# Patient Record
Sex: Female | Born: 1950 | Race: White | Hispanic: No | Marital: Married | Smoking: Never smoker
Health system: Southern US, Community
[De-identification: ages and names within clinical notes are randomized; demographics above are authoritative.]

## PROBLEM LIST (undated history)

## (undated) DIAGNOSIS — I10 Essential (primary) hypertension: Secondary | ICD-10-CM

## (undated) DIAGNOSIS — G8929 Other chronic pain: Secondary | ICD-10-CM

## (undated) DIAGNOSIS — R87629 Unspecified abnormal cytological findings in specimens from vagina: Secondary | ICD-10-CM

## (undated) DIAGNOSIS — R1310 Dysphagia, unspecified: Secondary | ICD-10-CM

## (undated) DIAGNOSIS — T7840XA Allergy, unspecified, initial encounter: Secondary | ICD-10-CM

## (undated) DIAGNOSIS — M25559 Pain in unspecified hip: Secondary | ICD-10-CM

## (undated) DIAGNOSIS — I776 Arteritis, unspecified: Secondary | ICD-10-CM

## (undated) HISTORY — DX: Unspecified abnormal cytological findings in specimens from vagina: R87.629

## (undated) HISTORY — DX: Essential (primary) hypertension: I10

## (undated) HISTORY — DX: Dysphagia, unspecified: R13.10

## (undated) HISTORY — DX: Other chronic pain: G89.29

## (undated) HISTORY — DX: Allergy, unspecified, initial encounter: T78.40XA

## (undated) HISTORY — PX: COLONOSCOPY: SHX174

## (undated) HISTORY — PX: TUBAL LIGATION: SHX77

## (undated) HISTORY — PX: OTHER SURGICAL HISTORY: SHX169

## (undated) HISTORY — DX: Pain in unspecified hip: M25.559

---

## 2009-11-01 ENCOUNTER — Other Ambulatory Visit: Admission: RE | Admit: 2009-11-01 | Discharge: 2009-11-01 | Payer: Self-pay | Admitting: Obstetrics & Gynecology

## 2009-11-28 ENCOUNTER — Ambulatory Visit (HOSPITAL_COMMUNITY): Admission: RE | Admit: 2009-11-28 | Discharge: 2009-11-28 | Payer: Self-pay | Admitting: Obstetrics & Gynecology

## 2010-12-26 ENCOUNTER — Other Ambulatory Visit: Payer: Self-pay | Admitting: Obstetrics & Gynecology

## 2010-12-26 DIAGNOSIS — Z139 Encounter for screening, unspecified: Secondary | ICD-10-CM

## 2010-12-29 ENCOUNTER — Ambulatory Visit (HOSPITAL_COMMUNITY)
Admission: RE | Admit: 2010-12-29 | Discharge: 2010-12-29 | Disposition: A | Discharge status: Home / Self Care | Payer: 59 | Source: Ambulatory Visit | Attending: Obstetrics & Gynecology | Admitting: Obstetrics & Gynecology

## 2010-12-29 DIAGNOSIS — Z1231 Encounter for screening mammogram for malignant neoplasm of breast: Secondary | ICD-10-CM | POA: Insufficient documentation

## 2010-12-29 DIAGNOSIS — Z139 Encounter for screening, unspecified: Secondary | ICD-10-CM

## 2011-01-23 ENCOUNTER — Other Ambulatory Visit: Payer: Self-pay | Admitting: Obstetrics & Gynecology

## 2011-01-23 ENCOUNTER — Other Ambulatory Visit (HOSPITAL_COMMUNITY)
Admission: RE | Admit: 2011-01-23 | Discharge: 2011-01-23 | Disposition: A | Discharge status: Home / Self Care | Payer: 59 | Source: Ambulatory Visit | Attending: Obstetrics & Gynecology | Admitting: Obstetrics & Gynecology

## 2011-01-23 DIAGNOSIS — Z01419 Encounter for gynecological examination (general) (routine) without abnormal findings: Secondary | ICD-10-CM | POA: Insufficient documentation

## 2011-12-31 ENCOUNTER — Other Ambulatory Visit: Payer: Self-pay | Admitting: Obstetrics & Gynecology

## 2011-12-31 DIAGNOSIS — Z139 Encounter for screening, unspecified: Secondary | ICD-10-CM

## 2012-01-01 ENCOUNTER — Ambulatory Visit (HOSPITAL_COMMUNITY)
Admission: RE | Admit: 2012-01-01 | Discharge: 2012-01-01 | Disposition: A | Discharge status: Home / Self Care | Payer: 59 | Source: Ambulatory Visit | Attending: Obstetrics & Gynecology | Admitting: Obstetrics & Gynecology

## 2012-01-01 DIAGNOSIS — Z1231 Encounter for screening mammogram for malignant neoplasm of breast: Secondary | ICD-10-CM | POA: Insufficient documentation

## 2012-01-01 DIAGNOSIS — Z139 Encounter for screening, unspecified: Secondary | ICD-10-CM

## 2012-03-10 ENCOUNTER — Other Ambulatory Visit: Payer: Self-pay | Admitting: Obstetrics & Gynecology

## 2012-03-10 ENCOUNTER — Other Ambulatory Visit (HOSPITAL_COMMUNITY)
Admission: RE | Admit: 2012-03-10 | Discharge: 2012-03-10 | Disposition: A | Discharge status: Home / Self Care | Payer: 59 | Source: Ambulatory Visit | Attending: Obstetrics & Gynecology | Admitting: Obstetrics & Gynecology

## 2012-03-10 DIAGNOSIS — Z01419 Encounter for gynecological examination (general) (routine) without abnormal findings: Secondary | ICD-10-CM | POA: Insufficient documentation

## 2012-11-05 ENCOUNTER — Other Ambulatory Visit (HOSPITAL_COMMUNITY): Payer: Self-pay | Admitting: *Deleted

## 2012-11-05 ENCOUNTER — Ambulatory Visit (HOSPITAL_COMMUNITY)
Admission: RE | Admit: 2012-11-05 | Discharge: 2012-11-05 | Disposition: A | Discharge status: Home / Self Care | Payer: 59 | Source: Ambulatory Visit | Attending: *Deleted | Admitting: *Deleted

## 2012-11-05 DIAGNOSIS — R05 Cough: Secondary | ICD-10-CM

## 2012-11-05 DIAGNOSIS — R059 Cough, unspecified: Secondary | ICD-10-CM | POA: Insufficient documentation

## 2012-11-05 DIAGNOSIS — R0602 Shortness of breath: Secondary | ICD-10-CM | POA: Insufficient documentation

## 2012-11-05 DIAGNOSIS — R062 Wheezing: Secondary | ICD-10-CM | POA: Insufficient documentation

## 2012-12-01 ENCOUNTER — Other Ambulatory Visit (HOSPITAL_COMMUNITY): Payer: Self-pay | Admitting: Internal Medicine

## 2012-12-01 DIAGNOSIS — R05 Cough: Secondary | ICD-10-CM

## 2012-12-12 ENCOUNTER — Ambulatory Visit (HOSPITAL_COMMUNITY)
Admission: RE | Admit: 2012-12-12 | Discharge: 2012-12-12 | Disposition: A | Discharge status: Home / Self Care | Payer: 59 | Source: Ambulatory Visit | Attending: Internal Medicine | Admitting: Internal Medicine

## 2012-12-12 DIAGNOSIS — R059 Cough, unspecified: Secondary | ICD-10-CM | POA: Insufficient documentation

## 2012-12-12 DIAGNOSIS — R05 Cough: Secondary | ICD-10-CM | POA: Insufficient documentation

## 2012-12-23 NOTE — Procedures (Signed)
NAMEJETTIE, MANNOR           ACCOUNT NO.:  [id number]  MEDICAL RECORD NO.:  [id number]  LOCATION:                                 FACILITY:  PHYSICIAN:  Jaceion Aday L. Juanetta Gosling, M.D.DATE OF BIRTH:  1950/10/03  DATE OF PROCEDURE:  12/22/2012 DATE OF DISCHARGE:                           PULMONARY FUNCTION TEST   Reason for pulmonary function testing is cough. 1. Spirometry is normal. 2. Lung volumes are normal. 3. DLCO is minimally reduced, but corrects when volume is taken into     account. 4. Airway resistance is normal. 5. No cause of cough is seen on pulmonary function testing.     Lucindia Lemley L. Juanetta Gosling, M.D.     ELH/MEDQ  D:  12/22/2012  T:  12/23/2012  Job:  [id number]  cc:   Elita Quick, MD

## 2012-12-24 LAB — PULMONARY FUNCTION TEST

## 2013-01-19 ENCOUNTER — Other Ambulatory Visit: Payer: Self-pay | Admitting: Obstetrics & Gynecology

## 2013-01-19 DIAGNOSIS — Z139 Encounter for screening, unspecified: Secondary | ICD-10-CM

## 2013-01-29 ENCOUNTER — Ambulatory Visit (HOSPITAL_COMMUNITY)
Admission: RE | Admit: 2013-01-29 | Discharge: 2013-01-29 | Disposition: A | Discharge status: Home / Self Care | Payer: 59 | Source: Ambulatory Visit | Attending: Obstetrics & Gynecology | Admitting: Obstetrics & Gynecology

## 2013-01-29 DIAGNOSIS — Z1231 Encounter for screening mammogram for malignant neoplasm of breast: Secondary | ICD-10-CM | POA: Insufficient documentation

## 2013-01-29 DIAGNOSIS — Z139 Encounter for screening, unspecified: Secondary | ICD-10-CM

## 2013-03-18 ENCOUNTER — Encounter: Payer: Self-pay | Admitting: Obstetrics & Gynecology

## 2013-03-18 ENCOUNTER — Ambulatory Visit (INDEPENDENT_AMBULATORY_CARE_PROVIDER_SITE_OTHER): Payer: 59 | Admitting: Obstetrics & Gynecology

## 2013-03-18 ENCOUNTER — Other Ambulatory Visit (HOSPITAL_COMMUNITY)
Admission: RE | Admit: 2013-03-18 | Discharge: 2013-03-18 | Disposition: A | Discharge status: Home / Self Care | Payer: 59 | Source: Ambulatory Visit | Attending: Obstetrics & Gynecology | Admitting: Obstetrics & Gynecology

## 2013-03-18 VITALS — BP 120/80 | Ht 65.0 in | Wt 196.0 lb

## 2013-03-18 DIAGNOSIS — I10 Essential (primary) hypertension: Secondary | ICD-10-CM

## 2013-03-18 DIAGNOSIS — Z889 Allergy status to unspecified drugs, medicaments and biological substances status: Secondary | ICD-10-CM

## 2013-03-18 DIAGNOSIS — Z1151 Encounter for screening for human papillomavirus (HPV): Secondary | ICD-10-CM | POA: Insufficient documentation

## 2013-03-18 DIAGNOSIS — Z1212 Encounter for screening for malignant neoplasm of rectum: Secondary | ICD-10-CM

## 2013-03-18 DIAGNOSIS — Z01419 Encounter for gynecological examination (general) (routine) without abnormal findings: Secondary | ICD-10-CM

## 2013-03-18 NOTE — Progress Notes (Signed)
Patient ID: Elizabeth Roberson, female   DOB: Dec 11, 1950, 62 y.o.   MRN: [id number] Subjective:     Elizabeth Roberson is a 62 y.o. female here for a routine exam.  No LMP recorded. Patient is postmenopausal. No obstetric history on file. Current complaints: none.  Personal health questionnaire reviewed: no.   Gynecologic History No LMP recorded. Patient is postmenopausal. Contraception: post menopausal status Last Pap: 2013. Results were: normal Last mammogram: 2014. Results were: normal  Obstetric History OB History  No data available     The following portions of the patient's history were reviewed and updated as appropriate: allergies, current medications, past family history, past medical history, past social history, past surgical history and problem list.  Review of Systems  Review of Systems  Constitutional: Negative for fever, chills, weight loss, malaise/fatigue and diaphoresis.  HENT: Negative for hearing loss, ear pain, nosebleeds, congestion, sore throat, neck pain, tinnitus and ear discharge.   Eyes: Negative for blurred vision, double vision, photophobia, pain, discharge and redness.  Respiratory: Negative for cough, hemoptysis, sputum production, shortness of breath, wheezing and stridor.   Cardiovascular: Negative for chest pain, palpitations, orthopnea, claudication, leg swelling and PND.  Gastrointestinal: negative for abdominal pain. Negative for heartburn, nausea, vomiting, diarrhea, constipation, blood in stool and melena.  Genitourinary: Negative for dysuria, urgency, frequency, hematuria and flank pain.  Musculoskeletal: Negative for myalgias, back pain, joint pain and falls.  Skin: Negative for itching and rash.  Neurological: Negative for dizziness, tingling, tremors, sensory change, speech change, focal weakness, seizures, loss of consciousness, weakness and headaches.  Endo/Heme/Allergies: Negative for environmental allergies and polydipsia. Does not  bruise/bleed easily.  Psychiatric/Behavioral: Negative for depression, suicidal ideas, hallucinations, memory loss and substance abuse. The patient is not nervous/anxious and does not have insomnia.        Objective:    Physical Exam  Vitals reviewed. Constitutional: She is oriented to person, place, and time. She appears well-developed and well-nourished.  HENT:  Head: Normocephalic and atraumatic.        Right Ear: External ear normal.  Left Ear: External ear normal.  Nose: Nose normal.  Mouth/Throat: Oropharynx is clear and moist.  Eyes: Conjunctivae and EOM are normal. Pupils are equal, round, and reactive to light. Right eye exhibits no discharge. Left eye exhibits no discharge. No scleral icterus.  Neck: Normal range of motion. Neck supple. No tracheal deviation present. No thyromegaly present.  Cardiovascular: Normal rate, regular rhythm, normal heart sounds and intact distal pulses.  Exam reveals no gallop and no friction rub.   No murmur heard. Respiratory: Effort normal and breath sounds normal. No respiratory distress. She has no wheezes. She has no rales. She exhibits no tenderness.  GI: Soft. Bowel sounds are normal. She exhibits no distension and no mass. There is no tenderness. There is no rebound and no guarding.  Genitourinary:       Vulva is normal without lesions Vagina is pink moist without discharge Cervix normal in appearance and pap is done Uterus is normal size shape and contour Adnexa is negative with normal sized ovaries   Musculoskeletal: Normal range of motion. She exhibits no edema and no tenderness.  Neurological: She is alert and oriented to person, place, and time. She has normal reflexes. She displays normal reflexes. No cranial nerve deficit. She exhibits normal muscle tone. Coordination normal.  Skin: Skin is warm and dry. No rash noted. No erythema. No pallor.  Psychiatric: She has a normal mood and affect. Her  behavior is normal. Judgment and  thought content normal.       Assessment:    Healthy female exam.    Plan:    Mammogram ordered. Follow up in: 1 year.

## 2014-02-25 ENCOUNTER — Other Ambulatory Visit: Payer: Self-pay | Admitting: Obstetrics & Gynecology

## 2014-02-25 DIAGNOSIS — Z1231 Encounter for screening mammogram for malignant neoplasm of breast: Secondary | ICD-10-CM

## 2014-03-01 ENCOUNTER — Ambulatory Visit (HOSPITAL_COMMUNITY)
Admission: RE | Admit: 2014-03-01 | Discharge: 2014-03-01 | Disposition: A | Discharge status: Home / Self Care | Payer: 59 | Source: Ambulatory Visit | Attending: Obstetrics & Gynecology | Admitting: Obstetrics & Gynecology

## 2014-03-01 DIAGNOSIS — Z1231 Encounter for screening mammogram for malignant neoplasm of breast: Secondary | ICD-10-CM

## 2014-03-01 DIAGNOSIS — R928 Other abnormal and inconclusive findings on diagnostic imaging of breast: Secondary | ICD-10-CM | POA: Insufficient documentation

## 2014-03-03 ENCOUNTER — Other Ambulatory Visit: Payer: Self-pay | Admitting: Obstetrics & Gynecology

## 2014-03-03 DIAGNOSIS — R928 Other abnormal and inconclusive findings on diagnostic imaging of breast: Secondary | ICD-10-CM

## 2014-03-16 ENCOUNTER — Ambulatory Visit (HOSPITAL_COMMUNITY)
Admission: RE | Admit: 2014-03-16 | Discharge: 2014-03-16 | Disposition: A | Discharge status: Home / Self Care | Payer: 59 | Source: Ambulatory Visit | Attending: Obstetrics & Gynecology | Admitting: Obstetrics & Gynecology

## 2014-03-16 ENCOUNTER — Other Ambulatory Visit: Payer: Self-pay | Admitting: Obstetrics & Gynecology

## 2014-03-16 DIAGNOSIS — N6489 Other specified disorders of breast: Secondary | ICD-10-CM | POA: Diagnosis present

## 2014-03-16 DIAGNOSIS — R928 Other abnormal and inconclusive findings on diagnostic imaging of breast: Secondary | ICD-10-CM

## 2014-04-09 ENCOUNTER — Ambulatory Visit (INDEPENDENT_AMBULATORY_CARE_PROVIDER_SITE_OTHER): Payer: 59 | Admitting: Obstetrics & Gynecology

## 2014-04-09 ENCOUNTER — Encounter: Payer: Self-pay | Admitting: Obstetrics & Gynecology

## 2014-04-09 VITALS — BP 122/80 | Ht 65.0 in | Wt 196.0 lb

## 2014-04-09 DIAGNOSIS — Z1212 Encounter for screening for malignant neoplasm of rectum: Secondary | ICD-10-CM

## 2014-04-09 DIAGNOSIS — Z01419 Encounter for gynecological examination (general) (routine) without abnormal findings: Secondary | ICD-10-CM

## 2014-04-09 NOTE — Progress Notes (Signed)
Patient ID: Elizabeth Roberson, female   DOB: Feb 07, 1951, 63 y.o.   MRN: [id number] Subjective:     Elizabeth Roberson is a 63 y.o. female here for a routine exam.  No LMP recorded. Patient is postmenopausal. No obstetric history on file. Birth Control Method:  Negative  Menstrual Calendar(currently): amenorrheic  Current complaints: has had episodic infrequent LLQ pain sharp lasts about 1 year.   Current acute medical issues:  none   Recent Gynecologic History No LMP recorded. Patient is postmenopausal. Last Pap: 2014,  normal Last mammogram: 02/2014,  normal  Past Medical History  Diagnosis Date  . Hypertension   . Allergy     History reviewed. No pertinent past surgical history.  OB History   Grav Para Term Preterm Abortions TAB SAB Ect Mult Living                  History   Social History  . Marital Status: Married    Spouse Name: N/A    Number of Children: N/A  . Years of Education: N/A   Social History Main Topics  . Smoking status: Never Smoker   . Smokeless tobacco: Never Used  . Alcohol Use: No  . Drug Use: No  . Sexual Activity: Yes    Birth Control/ Protection: Post-menopausal   Other Topics Concern  . None   Social History Narrative  . None    Family History  Problem Relation Age of Onset  . COPD Mother   . Alzheimer's disease Mother   . Hypertension Son      Review of Systems  Review of Systems  Constitutional: Negative for fever, chills, weight loss, malaise/fatigue and diaphoresis.  HENT: Negative for hearing loss, ear pain, nosebleeds, congestion, sore throat, neck pain, tinnitus and ear discharge.   Eyes: Negative for blurred vision, double vision, photophobia, pain, discharge and redness.  Respiratory: Negative for cough, hemoptysis, sputum production, shortness of breath, wheezing and stridor.   Cardiovascular: Negative for chest pain, palpitations, orthopnea, claudication, leg swelling and PND.  Gastrointestinal: negative for  abdominal pain. Negative for heartburn, nausea, vomiting, diarrhea, constipation, blood in stool and melena.  Genitourinary: Negative for dysuria, urgency, frequency, hematuria and flank pain.  Musculoskeletal: Negative for myalgias, back pain, joint pain and falls.  Skin: Negative for itching and rash.  Neurological: Negative for dizziness, tingling, tremors, sensory change, speech change, focal weakness, seizures, loss of consciousness, weakness and headaches.  Endo/Heme/Allergies: Negative for environmental allergies and polydipsia. Does not bruise/bleed easily.  Psychiatric/Behavioral: Negative for depression, suicidal ideas, hallucinations, memory loss and substance abuse. The patient is not nervous/anxious and does not have insomnia.        Objective:    Physical Exam  Vitals reviewed. Constitutional: She is oriented to person, place, and time. She appears well-developed and well-nourished.  HENT:  Head: Normocephalic and atraumatic.        Right Ear: External ear normal.  Left Ear: External ear normal.  Nose: Nose normal.  Mouth/Throat: Oropharynx is clear and moist.  Eyes: Conjunctivae and EOM are normal. Pupils are equal, round, and reactive to light. Right eye exhibits no discharge. Left eye exhibits no discharge. No scleral icterus.  Neck: Normal range of motion. Neck supple. No tracheal deviation present. No thyromegaly present.  Cardiovascular: Normal rate, regular rhythm, normal heart sounds and intact distal pulses.  Exam reveals no gallop and no friction rub.   No murmur heard. Respiratory: Effort normal and breath sounds normal. No respiratory distress. She  has no wheezes. She has no rales. She exhibits no tenderness.  GI: Soft. Bowel sounds are normal. She exhibits no distension and no mass. There is no tenderness. There is no rebound and no guarding.  Genitourinary:  Breasts no masses skin changes or nipple changes bilaterally      Vulva is normal without  lesions Vagina is pink moist without discharge Cervix normal in appearance and pap is done Uterus is normal size shape and contour Adnexa is negative with normal sized ovaries  Rectal    hemoccult negative, normal tone, no masses  Musculoskeletal: Normal range of motion. She exhibits no edema and no tenderness.  Neurological: She is alert and oriented to person, place, and time. She has normal reflexes. She displays normal reflexes. No cranial nerve deficit. She exhibits normal muscle tone. Coordination normal.  Skin: Skin is warm and dry. No rash noted. No erythema. No pallor.  Psychiatric: She has a normal mood and affect. Her behavior is normal. Judgment and thought content normal.       Assessment:    Healthy female exam.    Plan:    Follow up in: 1 year.

## 2014-06-22 ENCOUNTER — Encounter (INDEPENDENT_AMBULATORY_CARE_PROVIDER_SITE_OTHER): Payer: Self-pay | Admitting: *Deleted

## 2014-06-29 ENCOUNTER — Telehealth (INDEPENDENT_AMBULATORY_CARE_PROVIDER_SITE_OTHER): Payer: Self-pay | Admitting: *Deleted

## 2014-06-29 ENCOUNTER — Other Ambulatory Visit (INDEPENDENT_AMBULATORY_CARE_PROVIDER_SITE_OTHER): Payer: Self-pay | Admitting: *Deleted

## 2014-06-29 ENCOUNTER — Encounter (INDEPENDENT_AMBULATORY_CARE_PROVIDER_SITE_OTHER): Payer: Self-pay | Admitting: Internal Medicine

## 2014-06-29 ENCOUNTER — Ambulatory Visit (INDEPENDENT_AMBULATORY_CARE_PROVIDER_SITE_OTHER): Payer: 59 | Admitting: Internal Medicine

## 2014-06-29 VITALS — BP 92/70 | HR 68 | Temp 97.4°F | Ht 65.0 in | Wt 197.8 lb

## 2014-06-29 DIAGNOSIS — R1310 Dysphagia, unspecified: Secondary | ICD-10-CM

## 2014-06-29 DIAGNOSIS — Z1211 Encounter for screening for malignant neoplasm of colon: Secondary | ICD-10-CM

## 2014-06-29 DIAGNOSIS — R1314 Dysphagia, pharyngoesophageal phase: Secondary | ICD-10-CM

## 2014-06-29 MED ORDER — PEG-KCL-NACL-NASULF-NA ASC-C 100 G PO SOLR: 1.0000 | Freq: Once | ORAL | Status: DC

## 2014-06-29 NOTE — Telephone Encounter (Signed)
Patient needs movi prep 

## 2014-06-29 NOTE — Progress Notes (Addendum)
   Subjective:    Patient ID: Elizabeth Roberson, female    DOB: [birth year], 63 y.o.   MRN: [id number]  HPI Referred to our office by Dr.Junell Koberlain for dysphagia.She tells me she has had problems with dysphagia. Foods are lodging. She feel like she is going to choke. She says when the food lodges, she cannot cough it up. Foods have been lodging for 3-6 months. Not everyday, but occasionally. No specific food. Chicken tenders and rice give her problems.  Started Omeprazole and Pepcid Sunday.  She has cramps in her upper back after standing to do dishes. No acid reflux since starting the PPI.  Burping and gas are better also since starting. Appetite is good. No weight loss.  No abdominal pain. She usually has a BM daily. No melena or BRRB.   Patient also states her last colonoscopy was in [place] 10 yrs ago and was advised to have a repeat in 5 yrs. 05/25/2004 Colonoscopy Dr. Irena Cords: Guaiac positive stool Erythema at hepatic flexure. Otherwise normal colon Biopsy:Coloic mucosa with no pathologic abnormality.  No family hx of colon cancer  10/23/20154 H and H 13.9 AND 40.0, MCV 85.8, platelet ct 340 ALP 96, AST 12, ALT 14, TOTAL Bili 0.4 TSH 2.015 ESR 6 ANA negative    Review of Systems  Married, three children in good health. Works for [employer] in [place].   Past Medical History  Diagnosis Date  . Hypertension   . Allergy     Past Surgical History  Procedure Laterality Date  . Vasculitis      No Known Allergies  Current Outpatient Prescriptions on File Prior to Visit  Medication Sig Dispense Refill  . levocetirizine (XYZAL) 5 MG tablet Take 10 mg by mouth every evening.    Marland Kitchen losartan (COZAAR) 50 MG tablet Take 50 mg by mouth daily.    . MELOXICAM PO Take 1 tablet by mouth as needed.    . montelukast (SINGULAIR) 10 MG tablet Take 10 mg by mouth at bedtime.     No current facility-administered medications on file prior to visit.        Objective:   Physical Exam  Filed Vitals:   06/29/14 1027  Height: $Remove'5\' 5"'sUFRhFP$  (1.651 m)  Weight: 197 lb 12.8 oz (89.721 kg)   Alert and oriented. Skin warm and dry. Oral mucosa is moist.   . Sclera anicteric, conjunctivae is pink. Thyroid not enlarged. No cervical lymphadenopathy. Lungs clear. Heart regular rate and rhythm.  Abdomen is soft. Bowel sounds are positive. No hepatomegaly. No abdominal masses felt. No tenderness.  No edema to lower extremities.      Assessment & Plan:  PUD needs to be ruled out. Stricture also needs to be ruled with recent hx of dysphagia. EGD/ED, Colonoscopy Continue the Protonix and Pepcid for now

## 2014-06-29 NOTE — Patient Instructions (Addendum)
EGD/ED, Colonoscopy. -The risks and benefits such as perforation, bleeding, and infection were reviewed with the patient and is agreeable. 

## 2014-06-30 ENCOUNTER — Encounter (INDEPENDENT_AMBULATORY_CARE_PROVIDER_SITE_OTHER): Payer: Self-pay

## 2014-07-09 ENCOUNTER — Encounter (HOSPITAL_COMMUNITY)
Admission: RE | Disposition: A | Discharge status: Home / Self Care | Payer: Self-pay | Source: Ambulatory Visit | Attending: Internal Medicine

## 2014-07-09 ENCOUNTER — Ambulatory Visit (HOSPITAL_COMMUNITY)
Admission: RE | Admit: 2014-07-09 | Discharge: 2014-07-09 | Disposition: A | Discharge status: Home / Self Care | Payer: 59 | Source: Ambulatory Visit | Attending: Internal Medicine | Admitting: Internal Medicine

## 2014-07-09 ENCOUNTER — Other Ambulatory Visit (INDEPENDENT_AMBULATORY_CARE_PROVIDER_SITE_OTHER): Payer: Self-pay | Admitting: Internal Medicine

## 2014-07-09 ENCOUNTER — Encounter (HOSPITAL_COMMUNITY): Payer: Self-pay | Admitting: *Deleted

## 2014-07-09 DIAGNOSIS — K208 Other esophagitis: Secondary | ICD-10-CM

## 2014-07-09 DIAGNOSIS — I10 Essential (primary) hypertension: Secondary | ICD-10-CM | POA: Insufficient documentation

## 2014-07-09 DIAGNOSIS — K2960 Other gastritis without bleeding: Secondary | ICD-10-CM | POA: Diagnosis not present

## 2014-07-09 DIAGNOSIS — Z1211 Encounter for screening for malignant neoplasm of colon: Secondary | ICD-10-CM | POA: Insufficient documentation

## 2014-07-09 DIAGNOSIS — K449 Diaphragmatic hernia without obstruction or gangrene: Secondary | ICD-10-CM | POA: Diagnosis not present

## 2014-07-09 DIAGNOSIS — R1310 Dysphagia, unspecified: Secondary | ICD-10-CM | POA: Insufficient documentation

## 2014-07-09 DIAGNOSIS — Z8249 Family history of ischemic heart disease and other diseases of the circulatory system: Secondary | ICD-10-CM | POA: Diagnosis not present

## 2014-07-09 DIAGNOSIS — D122 Benign neoplasm of ascending colon: Secondary | ICD-10-CM | POA: Diagnosis not present

## 2014-07-09 DIAGNOSIS — R109 Unspecified abdominal pain: Secondary | ICD-10-CM

## 2014-07-09 DIAGNOSIS — R1013 Epigastric pain: Secondary | ICD-10-CM

## 2014-07-09 DIAGNOSIS — K210 Gastro-esophageal reflux disease with esophagitis: Secondary | ICD-10-CM | POA: Diagnosis not present

## 2014-07-09 DIAGNOSIS — Z820 Family history of epilepsy and other diseases of the nervous system: Secondary | ICD-10-CM | POA: Diagnosis not present

## 2014-07-09 DIAGNOSIS — K644 Residual hemorrhoidal skin tags: Secondary | ICD-10-CM | POA: Insufficient documentation

## 2014-07-09 DIAGNOSIS — K6389 Other specified diseases of intestine: Secondary | ICD-10-CM

## 2014-07-09 HISTORY — DX: Arteritis, unspecified: I77.6

## 2014-07-09 HISTORY — PX: BALLOON DILATION: SHX5330

## 2014-07-09 HISTORY — PX: MALONEY DILATION: SHX5535

## 2014-07-09 HISTORY — PX: ESOPHAGOGASTRODUODENOSCOPY: SHX5428

## 2014-07-09 HISTORY — PX: COLONOSCOPY: SHX5424

## 2014-07-09 SURGERY — COLONOSCOPY
Anesthesia: Moderate Sedation

## 2014-07-09 MED ORDER — BUTAMBEN-TETRACAINE-BENZOCAINE 2-2-14 % EX AERO
INHALATION_SPRAY | CUTANEOUS | Status: DC | PRN
  Administered 2014-07-09: 2 via TOPICAL

## 2014-07-09 MED ORDER — MEPERIDINE HCL 50 MG/ML IJ SOLN
INTRAMUSCULAR | Status: AC
  Filled 2014-07-09: qty 1

## 2014-07-09 MED ORDER — MIDAZOLAM HCL 5 MG/5ML IJ SOLN
INTRAMUSCULAR | Status: DC | PRN
  Administered 2014-07-09 (×6): 2 mg via INTRAVENOUS

## 2014-07-09 MED ORDER — MIDAZOLAM HCL 5 MG/5ML IJ SOLN
INTRAMUSCULAR | Status: AC
  Filled 2014-07-09: qty 5

## 2014-07-09 MED ORDER — MIDAZOLAM HCL 5 MG/5ML IJ SOLN
INTRAMUSCULAR | Status: AC
  Filled 2014-07-09: qty 10

## 2014-07-09 MED ORDER — MEPERIDINE HCL 50 MG/ML IJ SOLN
INTRAMUSCULAR | Status: DC | PRN
  Administered 2014-07-09 (×2): 25 mg via INTRAVENOUS

## 2014-07-09 MED ORDER — SODIUM CHLORIDE 0.9 % IV SOLN
INTRAVENOUS | Status: DC
  Administered 2014-07-09: 07:00:00 via INTRAVENOUS

## 2014-07-09 MED ORDER — STERILE WATER FOR IRRIGATION IR SOLN
Status: DC | PRN
  Administered 2014-07-09: 08:00:00

## 2014-07-09 NOTE — Op Note (Signed)
EGD PROCEDURE REPORT  PATIENT:  Elizabeth Roberson  MR#:  [id number] Birthdate:  02-12-1951, 63 y.o., female Endoscopist:  Dr. Rogene Houston, MD Referred By:  Dr. Caren Macadam, MD Procedure Date: 07/09/2014  Procedure:   EGD, ED & Colonoscopy  Indications:  Patient is 63 year old Caucasian female who presents with 3-6 month history of intermittent solid food dysphagia. She does not have frequent heartburn. She was recently begun on omeprazole. She is also undergoing average risk screening colonoscopy. Last exam was 10 years ago.            Informed Consent:  The risks, benefits, alternatives & imponderables which include, but are not limited to, bleeding, infection, perforation, drug reaction and potential missed lesion have been reviewed.  The potential for biopsy, lesion removal, esophageal dilation, etc. have also been discussed.  Questions have been answered.  All parties agreeable.  Please see history & physical in medical record for more information.  Medications:  Demerol 50 mg IV Versed 12 mg IV Cetacaine spray topically for oropharyngeal anesthesia  EGD  Description of procedure:  The endoscope was introduced through the mouth and advanced to the second portion of the duodenum without difficulty or limitations. The mucosal surfaces were surveyed very carefully during advancement of the scope and upon withdrawal.  Findings:  Esophagus:  Mucosa of the proximal segment was normal. Mucosa in the distal half of the esophagus revealed coarse appearance and linear furrows but no obvious stricture noted. There was edema and erythema to mucosa at GE junction without ring or stricture formation. GEJ:  37 cm Hiatus:  39 cm Stomach:  Stomach was empty and distended very well with insufflation. Folds in the proximal stomach were normal. Examination of mucosa at gastric body was normal. Scattered antral erosions were noted. Pyloric channel was patent. Angularis fundus and cardia were  examined by retroflexion of scope and were normal. Duodenum:  Normal bulbar and post bulbar mucosa.  Therapeutic/Diagnostic Maneuvers Performed:   GE junction and distal segment of esophagus was dilated with balloon dilator. Balloon dilator was advanced through the scope. Guidewire was pushed to gastric lumen. Balloon dye was positioned across GE junction and insufflated to a diameter of 15 mm subsequent to 16.5 and 18 mm. Balloon dye was pushed distally and then deflated. No mucosal disruption was noted. Esophagus was also dilated by passing 54 [place] Maloney dilator to full insertion. Endoscope was passed again. No mucosal disruption noted to any segment of the esophagus. Antral biopsy was taken for CLOtest followed by esophageal biopsy looking for changes of eosinophilic esophagitis.  COLONOSCOPY Description of procedure:  After a digital rectal exam was performed, that colonoscope was advanced from the anus through the rectum and colon to the area of the cecum, ileocecal valve and appendiceal orifice. The cecum was deeply intubated. These structures were well-seen and photographed for the record. From the level of the cecum and ileocecal valve, the scope was slowly and cautiously withdrawn. The mucosal surfaces were carefully surveyed utilizing scope tip to flexion to facilitate fold flattening as needed. The scope was pulled down into the rectum where a thorough exam including retroflexion was performed.  Findings:   Prep satisfactory. Small polyp ablated via cold biopsy from ascending colon. Mucosa of rest of the colon and rectum was normal.  Small hemorrhoids below the dentate line along with three small anal papillae.  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 Mild changes of  reflux esophagitis limited to GE junction. Small sliding hiatal hernia. Erosive antral gastritis. GE junction was dilated with balloon dilator to 18 mm no mucosal disruption induced. Esophagus is also dilated by passing 54 [place] Maloney dilator and once again no mucosal disruption noted. Antral biopsy taken for CLOtest and esophageal biopsy taken to rule out eosinophilic esophagitis.  Colonoscopy findings; Examination performed to cecum. Small polyp ablated via cold biopsy from ascending colon. External hemorrhoids with 3 small anal papillae.   Recommendations:  Continue anti-reflux measures. Continue omeprazole 20 mg by mouth every morning. Upper abdominal ultrasound to be scheduled to further evaluate postprandial upper abdominal pain. I will be contacting patient with biopsy results.  REHMAN,NAJEEB U  07/09/2014 8:38 AM  CC: Dr. Ethlyn Gallery, Kassie Mends, MD & Dr. Rayne Du ref. provider found

## 2014-07-09 NOTE — H&P (Signed)
Elizabeth Roberson is an 63 y.o. female.   Chief Complaint: Patient is here for EGD, ED and colonoscopy. HPI: Patient is 63 year old Caucasian female who presents with 3-6 months history of intermittent solid food dysphagia. She points to suprasternal area soft bolus obstruction. Food bolus eventually passes down after 5-10 minutes. She rarely has heartburn. She denies nausea vomiting. Lately she's been experiencing midepigastric pain radiating into her back usually after evening meal. She denies melena or rectal bleeding. She's also undergoing screening colonoscopy. Last exam was in 2005. Family studies negative for CRC.  Past Medical History  Diagnosis Date  . Hypertension   . Allergy   . Vasculitis     Past Surgical History  Procedure Laterality Date  . Colonoscopy    . Tubal ligation    . Laser surgery on cervix      Family History  Problem Relation Age of Onset  . COPD Mother   . Alzheimer's disease Mother   . Hypertension Son    Social History:  reports that she has never smoked. She has never used smokeless tobacco. She reports that she does not drink alcohol or use illicit drugs.  Allergies: No Known Allergies  Medications Prior to Admission  Medication Sig Dispense Refill  . albuterol (PROVENTIL HFA;VENTOLIN HFA) 108 (90 BASE) MCG/ACT inhaler Inhale 2 puffs into the lungs every 6 (six) hours as needed for wheezing or shortness of breath.    . levocetirizine (XYZAL) 5 MG tablet Take 10 mg by mouth every evening.    Marland Kitchen losartan-hydrochlorothiazide (HYZAAR) 50-12.5 MG per tablet Take 1 tablet by mouth daily.  5  . meloxicam (MOBIC) 15 MG tablet Take 1 tablet by mouth daily as needed for pain.   5  . montelukast (SINGULAIR) 10 MG tablet Take 10 mg by mouth at bedtime.    Marland Kitchen omeprazole (PRILOSEC) 20 MG capsule Take 20 mg by mouth daily.    . peg 3350 powder (MOVIPREP) 100 G SOLR Take 1 kit (200 g total) by mouth once. 1 kit 0    No results found for this or any previous  visit (from the past 48 hour(s)). No results found.  ROS  Blood pressure 128/75, pulse 72, temperature 97.8 F (36.6 C), temperature source Oral, resp. rate 18, height _0  (1.651 m), weight 197 lb (89.359 kg), SpO2 97 %. Physical Exam  Constitutional: She appears well-developed and well-nourished.  HENT:  Mouth/Throat: Oropharynx is clear and moist.  Eyes: Conjunctivae are normal. No scleral icterus.  Neck: No thyromegaly present.  Cardiovascular: Normal rate, regular rhythm and normal heart sounds.   No murmur heard. Respiratory: Effort normal and breath sounds normal.  GI: Soft. She exhibits no distension and no mass. There is no tenderness.  Musculoskeletal: She exhibits no edema.  Lymphadenopathy:    She has no cervical adenopathy.  Neurological: She is alert.  Skin: Skin is warm and dry.     Assessment/Plan Solid food dysphagia. Postprandial upper abdominal pain. EGD with ED and average risk screening colonoscopy.  [name] U 07/09/2014, 7:37 AM

## 2014-07-09 NOTE — Discharge Instructions (Signed)
Resume usual medications and diet. No driving for 24 hours. Upper abdominal ultrasound to be scheduled.-Monday 12/21 at 7:45am. Nothing to eat or drink after midnight before your procedure. Physician will call with biopsy results.  Esophagogastroduodenoscopy Care After Refer to this sheet in the next few weeks. These instructions provide you with information on caring for yourself after your procedure. Your caregiver may also give you more specific instructions. Your treatment has been planned according to current medical practices, but problems sometimes occur. Call your caregiver if you have any problems or questions after your procedure.  HOME CARE INSTRUCTIONS  Do not eat or drink anything until the numbing medicine (local anesthetic) has worn off and your gag reflex has returned. You will know that the local anesthetic has worn off when you can swallow comfortably.  Do not drive for 12 hours after the procedure or as directed by your caregiver.  Only take medicines as directed by your caregiver. SEEK MEDICAL CARE IF:   You cannot stop coughing.  You are not urinating at all or less than usual. SEEK IMMEDIATE MEDICAL CARE IF:  You have difficulty swallowing.  You cannot eat or drink.  You have worsening throat or chest pain.  You have dizziness, lightheadedness, or you faint.  You have nausea or vomiting.  You have chills.  You have a fever.  You have severe abdominal pain.  You have black, tarry, or bloody stools. Document Released: 06/25/2012 Document Reviewed: 06/25/2012 [facility] Patient Information 2015 [place]. This information is not intended to replace advice given to you by your health care provider. Make sure you discuss any questions you have with your health care provider.   Gastroesophageal Reflux Disease, Adult Gastroesophageal reflux disease (GERD) happens when acid from your stomach goes into your food pipe (esophagus). The acid can cause a  burning feeling in your chest. Over time, the acid can make small holes (ulcers) in your food pipe.  HOME CARE  Ask your doctor for advice about:  Losing weight.  Quitting smoking.  Alcohol use.  Avoid foods and drinks that make your problems worse. You may want to avoid:  Caffeine and alcohol.  Chocolate.  Mints.  Garlic and onions.  Spicy foods.  Citrus fruits, such as oranges, lemons, or limes.  Foods that contain tomato, such as sauce, chili, salsa, and pizza.  Fried and fatty foods.  Avoid lying down for 3 hours before you go to bed or before you take a nap.  Eat small meals often, instead of large meals.  Wear loose-fitting clothing. Do not wear anything tight around your waist.  Raise (elevate) the head of your bed 6 to 8 inches with wood blocks. Using extra pillows does not help.  Only take medicines as told by your doctor.  Do not take aspirin or ibuprofen. GET HELP RIGHT AWAY IF:   You have pain in your arms, neck, jaw, teeth, or back.  Your pain gets worse or changes.  You feel sick to your stomach (nauseous), throw up (vomit), or sweat (diaphoresis).  You feel short of breath, or you pass out (faint).  Your throw up is green, yellow, black, or looks like coffee grounds or blood.  Your poop (stool) is red, bloody, or black. MAKE SURE YOU:   Understand these instructions.  Will watch your condition.  Will get help right away if you are not doing well or get worse. Document Released: 12/26/2007 Document Revised: 10/01/2011 Document Reviewed: 01/26/2011 [facility] Patient Information 2015 [place], [place].  This information is not intended to replace advice given to you by your health care provider. Make sure you discuss any questions you have with your health care provider.    Colonoscopy, Care After Refer to this sheet in the next few weeks. These instructions provide you with information on caring for yourself after your procedure. Your health  care provider may also give you more specific instructions. Your treatment has been planned according to current medical practices, but problems sometimes occur. Call your health care provider if you have any problems or questions after your procedure. WHAT TO EXPECT AFTER THE PROCEDURE  After your procedure, it is typical to have the following:  A small amount of blood in your stool.  Moderate amounts of gas and mild abdominal cramping or bloating. HOME CARE INSTRUCTIONS  Do not drive, operate machinery, or sign important documents for 24 hours.  You may shower and resume your regular physical activities, but move at a slower pace for the first 24 hours.  Take frequent rest periods for the first 24 hours.  Walk around or put a warm pack on your abdomen to help reduce abdominal cramping and bloating.  Drink enough fluids to keep your urine clear or pale yellow.  You may resume your normal diet as instructed by your health care provider. Avoid heavy or fried foods that are hard to digest.  Avoid drinking alcohol for 24 hours or as instructed by your health care provider.  Only take over-the-counter or prescription medicines as directed by your health care provider.  If a tissue sample (biopsy) was taken during your procedure:  Do not take aspirin or blood thinners for 7 days, or as instructed by your health care provider.  Do not drink alcohol for 7 days, or as instructed by your health care provider.  Eat soft foods for the first 24 hours. SEEK MEDICAL CARE IF: You have persistent spotting of blood in your stool 2-3 days after the procedure. SEEK IMMEDIATE MEDICAL CARE IF:  You have more than a small spotting of blood in your stool.  You pass large blood clots in your stool.  Your abdomen is swollen (distended).  You have nausea or vomiting.  You have a fever.  You have increasing abdominal pain that is not relieved with medicine. Document Released: 02/21/2004 Document  Revised: 04/29/2013 Document Reviewed: 03/16/2013 [facility] Patient Information 2015 [place], [place]. This information is not intended to replace advice given to you by your health care provider. Make sure you discuss any questions you have with your health care provider.  Colon Polyps Polyps are lumps of extra tissue growing inside the body. Polyps can grow in the large intestine (colon). Most colon polyps are noncancerous (benign). However, some colon polyps can become cancerous over time. Polyps that are larger than a pea may be harmful. To be safe, caregivers remove and test all polyps. CAUSES  Polyps form when mutations in the genes cause your cells to grow and divide even though no more tissue is needed. RISK FACTORS There are a number of risk factors that can increase your chances of getting colon polyps. They include:  Being older than 50 years.  Family history of colon polyps or colon cancer.  Long-term colon diseases, such as colitis or Crohn disease.  Being overweight.  Smoking.  Being inactive.  Drinking too much alcohol. SYMPTOMS  Most small polyps do not cause symptoms. If symptoms are present, they may include:  Blood in the stool. The stool may look  dark red or black.  Constipation or diarrhea that lasts longer than 1 week. DIAGNOSIS People often do not know they have polyps until their caregiver finds them during a regular checkup. Your caregiver can use 4 tests to check for polyps:  Digital rectal exam. The caregiver wears gloves and feels inside the rectum. This test would find polyps only in the rectum.  Barium enema. The caregiver puts a liquid called barium into your rectum before taking X-rays of your colon. Barium makes your colon look Hessie Varone. Polyps are dark, so they are easy to see in the X-ray pictures.  Sigmoidoscopy. A thin, flexible tube (sigmoidoscope) is placed into your rectum. The sigmoidoscope has a light and tiny camera in it. The caregiver uses  the sigmoidoscope to look at the last third of your colon.  Colonoscopy. This test is like sigmoidoscopy, but the caregiver looks at the entire colon. This is the most common method for finding and removing polyps. TREATMENT  Any polyps will be removed during a sigmoidoscopy or colonoscopy. The polyps are then tested for cancer. PREVENTION  To help lower your risk of getting more colon polyps:  Eat plenty of fruits and vegetables. Avoid eating fatty foods.  Do not smoke.  Avoid drinking alcohol.  Exercise every day.  Lose weight if recommended by your caregiver.  Eat plenty of calcium and folate. Foods that are rich in calcium include milk, cheese, and broccoli. Foods that are rich in folate include chickpeas, kidney beans, and spinach. HOME CARE INSTRUCTIONS Keep all follow-up appointments as directed by your caregiver. You may need periodic exams to check for polyps. SEEK MEDICAL CARE IF: You notice bleeding during a bowel movement. Document Released: 04/04/2004 Document Revised: 10/01/2011 Document Reviewed: 09/18/2011 [facility] Patient Information 2015 [place], [place]. This information is not intended to replace advice given to you by your health care provider. Make sure you discuss any questions you have with your health care provider.

## 2014-07-12 ENCOUNTER — Ambulatory Visit (HOSPITAL_COMMUNITY)
Admit: 2014-07-12 | Discharge: 2014-07-12 | Disposition: A | Discharge status: Home / Self Care | Payer: 59 | Source: Ambulatory Visit | Attending: Internal Medicine | Admitting: Internal Medicine

## 2014-07-12 DIAGNOSIS — K824 Cholesterolosis of gallbladder: Secondary | ICD-10-CM | POA: Insufficient documentation

## 2014-07-12 DIAGNOSIS — R1013 Epigastric pain: Secondary | ICD-10-CM | POA: Insufficient documentation

## 2014-07-12 DIAGNOSIS — R109 Unspecified abdominal pain: Secondary | ICD-10-CM

## 2014-07-13 ENCOUNTER — Encounter (HOSPITAL_COMMUNITY): Payer: Self-pay | Admitting: Internal Medicine

## 2014-08-02 ENCOUNTER — Encounter (INDEPENDENT_AMBULATORY_CARE_PROVIDER_SITE_OTHER): Payer: Self-pay | Admitting: *Deleted

## 2014-12-24 ENCOUNTER — Other Ambulatory Visit (HOSPITAL_COMMUNITY): Payer: Self-pay | Admitting: Family Medicine

## 2014-12-24 ENCOUNTER — Ambulatory Visit (HOSPITAL_COMMUNITY)
Admission: RE | Admit: 2014-12-24 | Discharge: 2014-12-24 | Disposition: A | Discharge status: Home / Self Care | Payer: 59 | Source: Ambulatory Visit | Attending: Family Medicine | Admitting: Family Medicine

## 2014-12-24 ENCOUNTER — Ambulatory Visit (HOSPITAL_COMMUNITY): Payer: 59

## 2014-12-24 DIAGNOSIS — M25552 Pain in left hip: Secondary | ICD-10-CM

## 2014-12-24 DIAGNOSIS — M25551 Pain in right hip: Secondary | ICD-10-CM

## 2015-01-27 ENCOUNTER — Encounter (INDEPENDENT_AMBULATORY_CARE_PROVIDER_SITE_OTHER): Payer: Self-pay | Admitting: *Deleted

## 2015-05-19 ENCOUNTER — Other Ambulatory Visit: Payer: Self-pay | Admitting: Obstetrics & Gynecology

## 2015-05-19 DIAGNOSIS — Z1231 Encounter for screening mammogram for malignant neoplasm of breast: Secondary | ICD-10-CM

## 2015-06-02 ENCOUNTER — Ambulatory Visit (HOSPITAL_COMMUNITY)
Admission: RE | Admit: 2015-06-02 | Discharge: 2015-06-02 | Disposition: A | Discharge status: Home / Self Care | Payer: 59 | Source: Ambulatory Visit | Attending: Obstetrics & Gynecology | Admitting: Obstetrics & Gynecology

## 2015-06-02 DIAGNOSIS — Z1231 Encounter for screening mammogram for malignant neoplasm of breast: Secondary | ICD-10-CM | POA: Insufficient documentation

## 2015-07-25 MED FILL — LOSARTAN-HCTZ 50-12.5 MG TA: 50-12.5 | 90 days supply | Qty: 90 | Fill #0

## 2015-07-26 MED FILL — MONTELUKAST SOD 10 MG TAB: 10 | 90 days supply | Qty: 90 | Fill #0

## 2015-08-09 DIAGNOSIS — Z1389 Encounter for screening for other disorder: Secondary | ICD-10-CM | POA: Diagnosis not present

## 2015-08-09 DIAGNOSIS — J209 Acute bronchitis, unspecified: Secondary | ICD-10-CM | POA: Diagnosis not present

## 2015-08-09 DIAGNOSIS — Z6831 Body mass index (BMI) 31.0-31.9, adult: Secondary | ICD-10-CM | POA: Diagnosis not present

## 2015-09-21 DIAGNOSIS — E6609 Other obesity due to excess calories: Secondary | ICD-10-CM | POA: Diagnosis not present

## 2015-09-21 DIAGNOSIS — R05 Cough: Secondary | ICD-10-CM | POA: Diagnosis not present

## 2015-09-21 DIAGNOSIS — K219 Gastro-esophageal reflux disease without esophagitis: Secondary | ICD-10-CM | POA: Diagnosis not present

## 2015-09-21 DIAGNOSIS — Z1389 Encounter for screening for other disorder: Secondary | ICD-10-CM | POA: Diagnosis not present

## 2015-09-21 DIAGNOSIS — Z6832 Body mass index (BMI) 32.0-32.9, adult: Secondary | ICD-10-CM | POA: Diagnosis not present

## 2015-09-28 DIAGNOSIS — H524 Presbyopia: Secondary | ICD-10-CM | POA: Diagnosis not present

## 2015-10-31 MED FILL — LOSARTAN-HCTZ 50-12.5 MG TA: 50-12.5 | 90 days supply | Qty: 90 | Fill #1

## 2015-10-31 MED FILL — MONTELUKAST SOD 10 MG TAB: 10 | 90 days supply | Qty: 90 | Fill #1

## 2015-10-31 MED FILL — LEVOCETIRIZINE 5 MG TABLET: 5 | 90 days supply | Qty: 90 | Fill #1

## 2015-12-12 ENCOUNTER — Other Ambulatory Visit (HOSPITAL_COMMUNITY)
Admission: RE | Admit: 2015-12-12 | Discharge: 2015-12-12 | Disposition: A | Discharge status: Home / Self Care | Payer: 59 | Source: Ambulatory Visit | Attending: Adult Health | Admitting: Adult Health

## 2015-12-12 ENCOUNTER — Encounter: Payer: Self-pay | Admitting: Adult Health

## 2015-12-12 ENCOUNTER — Ambulatory Visit (INDEPENDENT_AMBULATORY_CARE_PROVIDER_SITE_OTHER): Payer: 59 | Admitting: Adult Health

## 2015-12-12 VITALS — BP 134/82 | HR 70 | Ht 64.75 in | Wt 200.0 lb

## 2015-12-12 DIAGNOSIS — G8929 Other chronic pain: Secondary | ICD-10-CM | POA: Insufficient documentation

## 2015-12-12 DIAGNOSIS — Z01419 Encounter for gynecological examination (general) (routine) without abnormal findings: Secondary | ICD-10-CM | POA: Insufficient documentation

## 2015-12-12 DIAGNOSIS — Z01411 Encounter for gynecological examination (general) (routine) with abnormal findings: Secondary | ICD-10-CM

## 2015-12-12 DIAGNOSIS — Z1151 Encounter for screening for human papillomavirus (HPV): Secondary | ICD-10-CM | POA: Diagnosis not present

## 2015-12-12 DIAGNOSIS — Z1211 Encounter for screening for malignant neoplasm of colon: Secondary | ICD-10-CM

## 2015-12-12 DIAGNOSIS — M25559 Pain in unspecified hip: Secondary | ICD-10-CM | POA: Diagnosis not present

## 2015-12-12 HISTORY — DX: Other chronic pain: G89.29

## 2015-12-12 LAB — HEMOCCULT GUIAC POC 1CARD (OFFICE): FECAL OCCULT BLD: NEGATIVE

## 2015-12-12 NOTE — Patient Instructions (Signed)
Physical in 1 year Pap in 3 if normal Mammogram yearly Colonoscopy per Dr Laural Golden

## 2015-12-12 NOTE — Progress Notes (Signed)
Patient ID: Elizabeth Roberson, female   DOB: 11/12/1950, 65 y.o.   MRN: [id number] History of Present Illness: Ashrita is a 65 year old white female, married in for a well woman gyn exam and pap.She complains of bilateral pain for past 20 years when walking for any time, had negative x-ays but has not had orthopedic consult, had DEXA years ago.She can bend and stretch with no problems, but if walks continuously for period of time hips hurt, but if stops and rests can continue then will hurt again. PCP is [facility].   Current Medications, Allergies, Past Medical History, Past Surgical History, Family History and Social History were reviewed in [employer] record.     Review of Systems: Patient denies any headaches, hearing loss, fatigue, blurred vision, shortness of breath, chest pain, abdominal pain, problems with bowel movements, urination, or intercourse(not often). No mood swings.+bilateral chronic hip pain    Physical Exam:BP 134/82 mmHg  Pulse 70  Ht 5' 4.75" (1.645 m)  Wt 200 lb (90.719 kg)  BMI 33.52 kg/m2 General:  Well developed, well nourished, no acute distress Skin:  Warm and dry,no rashes Neck:  Midline trachea, normal thyroid, good ROM, no lymphadenopathy,no carotid bruits heard Lungs; Clear to auscultation bilaterally Breast:  No dominant palpable mass, retraction, or nipple discharge Cardiovascular: Regular rate and rhythm Abdomen:  Soft, non tender, no hepatosplenomegaly Pelvic:  External genitalia is normal in appearance, no lesions.  The vagina has decreased color and rugae. The cervix is bulbous.  Uterus is felt to be normal size, shape, and contour.  No adnexal masses or tenderness noted.Bladder is non tender, no masses felt.urthrea has no masses or lesions Pap with HPV performed. Rectal: Good sphincter tone, no polyps, or hemorrhoids felt.  Hemoccult negative. Extremities/musculoskeletal:  No swelling or varicosities noted, no clubbing  or cyanosis Psych:  No mood changes, alert and cooperative,seems happy Encouraged to see orthopedic and get F/U DEXA and check vitamin D when has labs in June.Discussed water therapy might be good choice, discuss with PCP  Impression: Well woman gyn exam and pap Chronic bilateral hip pain    Plan: Physical in 1 year, pap in 3 if normal Mammogram yearly Colonoscopy 2025 Labs with PCP

## 2015-12-14 LAB — CYTOLOGY - PAP

## 2016-01-05 DIAGNOSIS — I776 Arteritis, unspecified: Secondary | ICD-10-CM | POA: Diagnosis not present

## 2016-01-05 DIAGNOSIS — M2550 Pain in unspecified joint: Secondary | ICD-10-CM | POA: Diagnosis not present

## 2016-01-05 DIAGNOSIS — K219 Gastro-esophageal reflux disease without esophagitis: Secondary | ICD-10-CM | POA: Diagnosis not present

## 2016-01-05 DIAGNOSIS — I10 Essential (primary) hypertension: Secondary | ICD-10-CM | POA: Diagnosis not present

## 2016-01-05 DIAGNOSIS — Z6833 Body mass index (BMI) 33.0-33.9, adult: Secondary | ICD-10-CM | POA: Diagnosis not present

## 2016-01-05 DIAGNOSIS — M549 Dorsalgia, unspecified: Secondary | ICD-10-CM | POA: Diagnosis not present

## 2016-01-05 DIAGNOSIS — Z1389 Encounter for screening for other disorder: Secondary | ICD-10-CM | POA: Diagnosis not present

## 2016-01-05 DIAGNOSIS — R5383 Other fatigue: Secondary | ICD-10-CM | POA: Diagnosis not present

## 2016-01-10 ENCOUNTER — Other Ambulatory Visit (HOSPITAL_COMMUNITY): Payer: Self-pay | Admitting: Internal Medicine

## 2016-01-10 DIAGNOSIS — M4800 Spinal stenosis, site unspecified: Secondary | ICD-10-CM

## 2016-02-02 ENCOUNTER — Ambulatory Visit (HOSPITAL_COMMUNITY)
Admission: RE | Admit: 2016-02-02 | Discharge: 2016-02-02 | Disposition: A | Discharge status: Home / Self Care | Payer: 59 | Source: Ambulatory Visit | Attending: Internal Medicine | Admitting: Internal Medicine

## 2016-02-02 DIAGNOSIS — M5136 Other intervertebral disc degeneration, lumbar region: Secondary | ICD-10-CM | POA: Diagnosis not present

## 2016-02-02 DIAGNOSIS — M4806 Spinal stenosis, lumbar region: Secondary | ICD-10-CM | POA: Diagnosis not present

## 2016-02-02 DIAGNOSIS — M4800 Spinal stenosis, site unspecified: Secondary | ICD-10-CM

## 2016-02-02 DIAGNOSIS — M47816 Spondylosis without myelopathy or radiculopathy, lumbar region: Secondary | ICD-10-CM | POA: Diagnosis not present

## 2016-02-10 MED FILL — LOSARTAN-HCTZ 50-12.5 MG TA: 50-12.5 | 90 days supply | Qty: 90 | Fill #0

## 2016-04-17 ENCOUNTER — Emergency Department (HOSPITAL_COMMUNITY): Payer: 59

## 2016-04-17 ENCOUNTER — Encounter (HOSPITAL_COMMUNITY): Payer: Self-pay | Admitting: Emergency Medicine

## 2016-04-17 ENCOUNTER — Emergency Department (HOSPITAL_COMMUNITY)
Admission: EM | Admit: 2016-04-17 | Discharge: 2016-04-18 | Disposition: A | Discharge status: Home / Self Care | Payer: 59 | Attending: Emergency Medicine | Admitting: Emergency Medicine

## 2016-04-17 DIAGNOSIS — Z791 Long term (current) use of non-steroidal anti-inflammatories (NSAID): Secondary | ICD-10-CM | POA: Diagnosis not present

## 2016-04-17 DIAGNOSIS — Z7982 Long term (current) use of aspirin: Secondary | ICD-10-CM | POA: Insufficient documentation

## 2016-04-17 DIAGNOSIS — Z79899 Other long term (current) drug therapy: Secondary | ICD-10-CM | POA: Insufficient documentation

## 2016-04-17 DIAGNOSIS — N12 Tubulo-interstitial nephritis, not specified as acute or chronic: Secondary | ICD-10-CM | POA: Diagnosis not present

## 2016-04-17 DIAGNOSIS — R1030 Lower abdominal pain, unspecified: Secondary | ICD-10-CM | POA: Diagnosis present

## 2016-04-17 DIAGNOSIS — I10 Essential (primary) hypertension: Secondary | ICD-10-CM | POA: Diagnosis not present

## 2016-04-17 DIAGNOSIS — R109 Unspecified abdominal pain: Secondary | ICD-10-CM | POA: Diagnosis not present

## 2016-04-17 LAB — BASIC METABOLIC PANEL
Anion gap: 10 (ref 5–15)
BUN: 10 mg/dL (ref 6–20)
CHLORIDE: 97 mmol/L — AB (ref 101–111)
CO2: 27 mmol/L (ref 22–32)
Calcium: 9.3 mg/dL (ref 8.9–10.3)
Creatinine, Ser: 0.97 mg/dL (ref 0.44–1.00)
GFR calc non Af Amer: >60 mL/min (ref >60)
Glucose, Bld: 110 mg/dL — ABNORMAL HIGH (ref 65–99)
POTASSIUM: 3.2 mmol/L — AB (ref 3.5–5.1)
SODIUM: 134 mmol/L — AB (ref 135–145)

## 2016-04-17 LAB — CBC WITH DIFFERENTIAL/PLATELET
BASOS PCT: 0 %
Basophils Absolute: 0 10*3/uL (ref 0.0–0.1)
EOS ABS: 0 10*3/uL (ref 0.0–0.7)
Eosinophils Relative: 0 %
HCT: 40 % (ref 36.0–46.0)
HEMOGLOBIN: 13.7 g/dL (ref 12.0–15.0)
LYMPHS ABS: 1.2 10*3/uL (ref 0.7–4.0)
Lymphocytes Relative: 7 %
MCH: 30.4 pg (ref 26.0–34.0)
MCHC: 34.3 g/dL (ref 30.0–36.0)
MCV: 88.9 fL (ref 78.0–100.0)
Monocytes Absolute: 1.3 10*3/uL — ABNORMAL HIGH (ref 0.1–1.0)
Monocytes Relative: 7 %
NEUTROS PCT: 86 %
Neutro Abs: 15.3 10*3/uL — ABNORMAL HIGH (ref 1.7–7.7)
Platelets: 289 10*3/uL (ref 150–400)
RBC: 4.5 MIL/uL (ref 3.87–5.11)
RDW: 13.1 % (ref 11.5–15.5)
WBC: 17.9 10*3/uL — AB (ref 4.0–10.5)

## 2016-04-17 LAB — URINALYSIS, ROUTINE W REFLEX MICROSCOPIC
Bilirubin Urine: NEGATIVE
GLUCOSE, UA: NEGATIVE mg/dL
NITRITE: NEGATIVE
Specific Gravity, Urine: 1.015 (ref 1.005–1.030)
pH: 6 (ref 5.0–8.0)

## 2016-04-17 LAB — URINE MICROSCOPIC-ADD ON

## 2016-04-17 MED ORDER — HYDROCODONE-ACETAMINOPHEN 5-325 MG PO TABS
1.0000 | ORAL_TABLET | Freq: Once | ORAL | Status: AC
Start: 2016-04-17 — End: 2016-04-17
  Administered 2016-04-17: 1 via ORAL
  Filled 2016-04-17: qty 1

## 2016-04-17 MED ORDER — DEXTROSE 5 % IV SOLN
1.0000 g | Freq: Once | INTRAVENOUS | Status: AC
  Administered 2016-04-17: 1 g via INTRAVENOUS
  Filled 2016-04-17: qty 10

## 2016-04-17 MED ORDER — ONDANSETRON HCL 4 MG/2ML IJ SOLN
4.0000 mg | Freq: Once | INTRAMUSCULAR | Status: AC
  Administered 2016-04-17: 4 mg via INTRAVENOUS
  Filled 2016-04-17: qty 2

## 2016-04-17 MED ORDER — MORPHINE SULFATE (PF) 4 MG/ML IV SOLN
4.0000 mg | Freq: Once | INTRAVENOUS | Status: AC
  Administered 2016-04-17: 4 mg via INTRAVENOUS
  Filled 2016-04-17: qty 1

## 2016-04-17 MED ORDER — ONDANSETRON 4 MG PO TBDP
4.0000 mg | ORAL_TABLET | Freq: Once | ORAL | Status: AC
  Administered 2016-04-17: 4 mg via ORAL
  Filled 2016-04-17: qty 1

## 2016-04-17 MED ORDER — SODIUM CHLORIDE 0.9 % IV BOLUS (SEPSIS)
1000.0000 mL | Freq: Once | INTRAVENOUS | Status: AC
Start: 2016-04-17 — End: 2016-04-17
  Administered 2016-04-17: 1000 mL via INTRAVENOUS

## 2016-04-17 MED ORDER — KETOROLAC TROMETHAMINE 30 MG/ML IJ SOLN
15.0000 mg | Freq: Once | INTRAMUSCULAR | Status: AC
  Administered 2016-04-17: 15 mg via INTRAVENOUS
  Filled 2016-04-17: qty 1

## 2016-04-17 MED ORDER — SODIUM CHLORIDE 0.9 % IV BOLUS (SEPSIS)
1000.0000 mL | Freq: Once | INTRAVENOUS | Status: AC
  Administered 2016-04-17: 1000 mL via INTRAVENOUS

## 2016-04-17 NOTE — ED Provider Notes (Signed)
[place] DEPT Provider Note   CSN: [id number] Arrival date & time: 04/17/16  1636     History   Chief Complaint Chief Complaint  Patient presents with  . Flank Pain    HPI Elizabeth Roberson is a 65 y.o. female.  HPI 65 year old female with past medical history of vasculitis who presents with initially suprapubic and now bilateral flank pain. Patient states her symptoms started several days ago as an aching, gnawing acute onset of pain in her suprapubic area radiating towards her left flank. Since then, she has had foul-smelling and "strong" urine but denies any dysuria. She has had increased urinary frequency. Over the last 24 hours she reports worsening flank pain as well as nausea but no vomiting. She has had little by mouth intake due to this nausea. Denies any history of STDs UTIs or kidney stones. She did have a fever to 102 yesterday but denies fever today.  Past Medical History:  Diagnosis Date  . Allergy   . Hip pain, chronic 12/12/2015   Has bilateral hip for past 20 years when walks more than a few minutes, no loss of ROM  . Hypertension   . Vaginal Pap smear, abnormal   . Vasculitis [facility])     Patient Active Problem List   Diagnosis Date Noted  . Hip pain, chronic 12/12/2015  . Hypertension 03/18/2013  . Multiple allergies 03/18/2013    Past Surgical History:  Procedure Laterality Date  . BALLOON DILATION N/A 07/09/2014   Procedure: BALLOON DILATION;  Surgeon: Rogene Houston, MD;  [facility];  Service: Endoscopy;  Laterality: N/A;  . COLONOSCOPY    . COLONOSCOPY N/A 07/09/2014   Procedure: COLONOSCOPY;  Surgeon: Rogene Houston, MD;  Location: [facility];  Service: Endoscopy;  Laterality: N/A;  730  . ESOPHAGOGASTRODUODENOSCOPY N/A 07/09/2014   Procedure: ESOPHAGOGASTRODUODENOSCOPY (EGD);  Surgeon: Rogene Houston, MD;  Location: [facility];  Service: Endoscopy;  Laterality: N/A;  . laser surgery on cervix    . MALONEY DILATION N/A  07/09/2014   Procedure: Venia Minks DILATION;  Surgeon: Rogene Houston, MD;  Location: [facility];  Service: Endoscopy;  Laterality: N/A;  . TUBAL LIGATION      OB History    Gravida Para Term Preterm AB Living   3 3       3    SAB TAB Ectopic Multiple Live Births                   Home Medications    Prior to Admission medications   Medication Sig Start Date End Date Taking? Authorizing Provider  acetaminophen (TYLENOL) 500 MG tablet Take 1,000 mg by mouth daily as needed for mild pain or moderate pain.   Yes Historical Provider, MD  albuterol (PROVENTIL HFA;VENTOLIN HFA) 108 (90 BASE) MCG/ACT inhaler Inhale 2 puffs into the lungs every 6 (six) hours as needed for wheezing or shortness of breath.   Yes Historical Provider, MD  aspirin EC 325 MG tablet Take 650 mg by mouth daily as needed for mild pain or moderate pain.   Yes Historical Provider, MD  ibuprofen (ADVIL,MOTRIN) 200 MG tablet Take 400 mg by mouth daily as needed for mild pain or moderate pain.   Yes Historical Provider, MD  loratadine (ALLERGY) 10 MG tablet Take 10 mg by mouth daily as needed for allergies or rhinitis.   Yes Historical Provider, MD  losartan-hydrochlorothiazide (HYZAAR) 50-12.5 MG per tablet Take 1 tablet by mouth daily.  05/26/14  Yes Historical Provider, MD  phentermine (ADIPEX-P) 37.5 MG tablet Take by mouth daily before breakfast. 1/2 tablet daily in the morning   Yes Historical Provider, MD  cefpodoxime (VANTIN) 200 MG tablet Take 1 tablet (200 mg total) by mouth 2 (two) times daily. 04/18/16 04/28/16  Duffy Bruce, MD  HYDROcodone-acetaminophen (NORCO/VICODIN) 5-325 MG tablet Take 1 tablet by mouth every 4 (four) hours as needed. 04/18/16   Duffy Bruce, MD  ibuprofen (ADVIL,MOTRIN) 600 MG tablet Take 1 tablet (600 mg total) by mouth every 8 (eight) hours as needed for mild pain or moderate pain. 04/18/16   Duffy Bruce, MD  levocetirizine (XYZAL) 5 MG tablet Take 10 mg by mouth daily as needed for  allergies.     Historical Provider, MD  montelukast (SINGULAIR) 10 MG tablet Take 10 mg by mouth daily as needed (for allergies).     Historical Provider, MD  ondansetron (ZOFRAN ODT) 4 MG disintegrating tablet Take 1 tablet (4 mg total) by mouth every 8 (eight) hours as needed for nausea or vomiting. 04/17/16   Duffy Bruce, MD    Family History Family History  Problem Relation Age of Onset  . COPD Mother   . Alzheimer's disease Mother   . Hypertension Son     Social History Social History  Substance Use Topics  . Smoking status: Never Smoker  . Smokeless tobacco: Never Used  . Alcohol use No     Allergies   Review of patient's allergies indicates no known allergies.   Review of Systems Review of Systems  Constitutional: Positive for fatigue. Negative for chills and fever.  HENT: Negative for congestion, rhinorrhea and sore throat.   Eyes: Negative for visual disturbance.  Respiratory: Negative for cough, shortness of breath and wheezing.   Cardiovascular: Negative for chest pain and leg swelling.  Gastrointestinal: Positive for nausea. Negative for abdominal pain, diarrhea and vomiting.  Genitourinary: Positive for flank pain and frequency. Negative for dysuria, vaginal bleeding and vaginal discharge.  Musculoskeletal: Negative for neck pain.  Skin: Negative for rash.  Allergic/Immunologic: Negative for immunocompromised state.  Neurological: Negative for syncope and headaches.  Hematological: Does not bruise/bleed easily.  All other systems reviewed and are negative.    Physical Exam Updated Vital Signs BP 115/78   Pulse 84   Temp 98.7 F (37.1 C) (Oral)   Resp 18   Ht 5\' 5"  (1.651 m)   Wt 178 lb (80.7 kg)   SpO2 100%   BMI 29.62 kg/m   Physical Exam  Constitutional: She is oriented to person, place, and time. She appears well-developed and well-nourished. No distress.  HENT:  Head: Normocephalic and atraumatic.  Eyes: Conjunctivae are normal.  Neck:  Neck supple.  Cardiovascular: Normal rate, regular rhythm and normal heart sounds.  Exam reveals no friction rub.   No murmur heard. Pulmonary/Chest: Effort normal and breath sounds normal. No respiratory distress. She has no wheezes. She has no rales.  Abdominal: Soft. Bowel sounds are normal. She exhibits no distension. There is tenderness (Mild, suprapubic).  No CVA tenderness bilaterally  Musculoskeletal: She exhibits no edema.  Neurological: She is alert and oriented to person, place, and time. She exhibits normal muscle tone.  Skin: Skin is warm. Capillary refill takes less than 2 seconds.  Psychiatric: She has a normal mood and affect.  Nursing note and vitals reviewed.    ED Treatments / Results  Labs (all labs ordered are listed, but only abnormal results are displayed) Labs Reviewed  URINALYSIS, ROUTINE W REFLEX MICROSCOPIC (NOT AT [facility]) - Abnormal; Notable for the following:       Result Value   Hgb urine dipstick SMALL (*)    Ketones, ur TRACE (*)    Protein, ur TRACE (*)    Leukocytes, UA SMALL (*)    All other components within normal limits  URINE MICROSCOPIC-ADD ON - Abnormal; Notable for the following:    Squamous Epithelial / LPF 0-5 (*)    Bacteria, UA FEW (*)    All other components within normal limits  CBC WITH DIFFERENTIAL/PLATELET - Abnormal; Notable for the following:    WBC 17.9 (*)    Neutro Abs 15.3 (*)    Monocytes Absolute 1.3 (*)    All other components within normal limits  BASIC METABOLIC PANEL - Abnormal; Notable for the following:    Sodium 134 (*)    Potassium 3.2 (*)    Chloride 97 (*)    Glucose, Bld 110 (*)    All other components within normal limits  URINE CULTURE    EKG  EKG Interpretation None       Radiology Ct Renal Stone Study  Result Date: 04/17/2016 CLINICAL DATA:  65 year old female with right flank pain, and nausea. EXAM: CT ABDOMEN AND PELVIS WITHOUT CONTRAST TECHNIQUE: Multidetector CT imaging of the abdomen and  pelvis was performed following the standard protocol without IV contrast. COMPARISON:  Lumbar spine MRI dated 02/02/2016 FINDINGS: Evaluation of this exam is limited in the absence of intravenous contrast. Lower chest: The visualized lung bases are clear. No intra-abdominal free air or free fluid. Hepatobiliary: No focal liver abnormality is seen. No gallstones, gallbladder wall thickening, or biliary dilatation. Pancreas: Ill-defined 11 x 12 mm hypodense area in the region of the head of the pancreas (series 4, image 31 and coronal image 39) most likely represents interspersed fat. The pancreatic lesion is less likely. Further evaluation with nonemergent MRI without and with contrast is recommended. There is no gland atrophy or dilatation of the main pancreatic duct. No peripancreatic inflammatory changes. Spleen: Normal in size without focal abnormality. Adrenals/Urinary Tract: Adrenal glands are unremarkable. Kidneys are normal, without renal calculi, focal lesion, or hydronephrosis. Bladder is unremarkable. Stomach/Bowel: There is no evidence of bowel obstruction or active inflammation. The appendix is not visualized with certainty. No inflammatory changes identified in the right lower quadrant. Vascular/Lymphatic: No significant vascular findings are present. No enlarged abdominal or pelvic lymph nodes. Reproductive: The uterus is anteverted. Small calcific focus in the uterine fundus likely represents a small fibroid. The ovaries are grossly unremarkable. Other: None Musculoskeletal: Mild degenerative changes of the spine. No acute fracture. IMPRESSION: No acute intra-abdominal or pelvic pathology. Specifically no hydronephrosis or nephrolithiasis. **An incidental finding of potential clinical significance has been found. Ill-defined 11 x 12 mm hypodense focus in the region of the head of the pancreas, likely interspersed fat. Further evaluation with nonemergent MRI without and with contrast recommended.**  Electronically Signed   By: Anner Crete M.D.   On: 04/17/2016 22:39    Procedures Procedures (including critical care time)  Medications Ordered in ED Medications  sodium chloride 0.9 % bolus 1,000 mL (0 mLs Intravenous Stopped 04/17/16 2158)  ketorolac (TORADOL) 30 MG/ML injection 15 mg (15 mg Intravenous Given 04/17/16 2013)  morphine 4 MG/ML injection 4 mg (4 mg Intravenous Given 04/17/16 2014)  ondansetron (ZOFRAN) injection 4 mg (4 mg Intravenous Given 04/17/16 2013)  cefTRIAXone (ROCEPHIN) 1 g in dextrose 5 % 50 mL IVPB (  0 g Intravenous Stopped 04/17/16 2053)  morphine 4 MG/ML injection 4 mg (4 mg Intravenous Given 04/17/16 2158)  sodium chloride 0.9 % bolus 1,000 mL (0 mLs Intravenous Stopped 04/17/16 2358)  HYDROcodone-acetaminophen (NORCO/VICODIN) 5-325 MG per tablet 1 tablet (1 tablet Oral Given 04/17/16 2356)  ondansetron (ZOFRAN-ODT) disintegrating tablet 4 mg (4 mg Oral Given 04/17/16 2342)     Initial Impression / Assessment and Plan / ED Course  I have reviewed the triage vital signs and the nursing notes.  Pertinent labs & imaging results that were available during my care of the patient were reviewed by me and considered in my medical decision making (see chart for details).  Clinical Course    65 yo F with no significant PMHx who p/w a several day h/o suprapubic pain and now flank pain, with nausea but no vomiting. No fevers. On arrival, VSS and WNL. Exam is as above, with mild SP TTP. No rebound or guarding. No overt CVAT. Initial lab work shows leukocytosis of 17.9k, UA with 6-30 WBCs, few bacteria. BMP with baseline renal function. Labs, story, and UA are c/w likely acute ascending UTI with developing pyelo. Given significant pain, CT stone obtained and shows no evidence of obstructing or infected stone. No other acute intra-abdominal pathology. Will give fluids, Rocephin, and re-assess.  Pt's VS remain stable in ED. Rocephin, IV given. Nausea improved with zofran and  she is tolerating PO without difficulty. Suspect mild pyelonephritis. Pt is not diabetic, immune suppressed, or otherwise at [facility] for invasive infection. Discussed management options with pt - will trial outpt management with vantin, antiemetics, pain control, and PCP f/u. Return precautions given including worsening fever, pain, nausea/emesis. Pt in agreement.  Final Clinical Impressions(s) / ED Diagnoses   Final diagnoses:  Pyelonephritis    New Prescriptions Discharge Medication List as of 04/18/2016 12:03 AM    START taking these medications   Details  cefpodoxime (VANTIN) 200 MG tablet Take 1 tablet (200 mg total) by mouth 2 (two) times daily., Starting Wed 04/18/2016, Until Sat 04/28/2016, Print    HYDROcodone-acetaminophen (NORCO/VICODIN) 5-325 MG tablet Take 1 tablet by mouth every 4 (four) hours as needed., Starting Wed 04/18/2016, Print    !! ibuprofen (ADVIL,MOTRIN) 600 MG tablet Take 1 tablet (600 mg total) by mouth every 8 (eight) hours as needed for mild pain or moderate pain., Starting Wed 04/18/2016, Print    ondansetron (ZOFRAN ODT) 4 MG disintegrating tablet Take 1 tablet (4 mg total) by mouth every 8 (eight) hours as needed for nausea or vomiting., Starting Tue 04/17/2016, Print     !! - Potential duplicate medications found. Please discuss with provider.       Duffy Bruce, MD 04/18/16 1130

## 2016-04-17 NOTE — ED Triage Notes (Signed)
Pt reports right flank pain with nausea starting this morning.  Denies v/d.  Pt currently on a diet that is very strict. Urine has odor, denies dysuria.

## 2016-04-17 NOTE — ED Notes (Signed)
Charge attempting to get peripheral IV

## 2016-04-17 NOTE — ED Notes (Signed)
Radiology, Elizabeth C. has pt for CT.

## 2016-04-18 MED ORDER — HYDROCODONE-ACETAMINOPHEN 5-325 MG PO TABS: 1.0000 | ORAL_TABLET | ORAL | 0 refills | Status: DC | PRN

## 2016-04-18 MED ORDER — CEFPODOXIME PROXETIL 200 MG PO TABS: 200.0000 mg | ORAL_TABLET | Freq: Two times a day (BID) | ORAL | 0 refills | Status: AC

## 2016-04-18 MED ORDER — ONDANSETRON 4 MG PO TBDP
4.0000 mg | ORAL_TABLET | Freq: Three times a day (TID) | ORAL | 0 refills | Status: DC | PRN
Start: 2016-04-17 — End: 2016-12-25

## 2016-04-18 MED ORDER — IBUPROFEN 600 MG PO TABS: 600.0000 mg | ORAL_TABLET | Freq: Three times a day (TID) | ORAL | 0 refills | Status: DC | PRN

## 2016-04-18 NOTE — ED Notes (Signed)
Pt alert & oriented x4, stable gait. Patient given discharge instructions, paperwork & prescription(s). Patient informed not to drive, operate any equipment & handel any important documents 4 hours after taking pain medication. Patient  instructed to stop at the registration desk to finish any additional paperwork. Patient  verbalized understanding. Pt left department w/ no further questions. 

## 2016-04-19 DIAGNOSIS — N390 Urinary tract infection, site not specified: Secondary | ICD-10-CM | POA: Diagnosis not present

## 2016-04-19 DIAGNOSIS — K862 Cyst of pancreas: Secondary | ICD-10-CM | POA: Diagnosis not present

## 2016-04-19 DIAGNOSIS — N289 Disorder of kidney and ureter, unspecified: Secondary | ICD-10-CM | POA: Diagnosis not present

## 2016-04-20 LAB — URINE CULTURE: Culture: >=100000 — AB

## 2016-04-21 ENCOUNTER — Telehealth (HOSPITAL_BASED_OUTPATIENT_CLINIC_OR_DEPARTMENT_OTHER): Payer: Self-pay

## 2016-04-21 NOTE — Telephone Encounter (Signed)
Post ED Visit - Positive Culture Follow-up  Culture report reviewed by antimicrobial stewardship pharmacist:  []  Elenor Quinones, Pharm.D. []  Heide Guile, Pharm.D., BCPS []  Parks Neptune, Pharm.D. []  Alycia Rossetti, Pharm.D., BCPS []  [place], Pharm.D., BCPS, AAHIVP []  Legrand Como, Pharm.D., BCPS, AAHIVP []  Milus Glazier, Pharm.D. []  Stephens November, [place].D. Dimitri Ped Pharm D Positive urine culture Treated with Cefpodoxime, organism sensitive to the same and no further patient follow-up is required at this time.  Genia Del 04/21/2016, 9:06 AM

## 2016-04-23 DIAGNOSIS — N390 Urinary tract infection, site not specified: Secondary | ICD-10-CM | POA: Diagnosis not present

## 2016-04-23 DIAGNOSIS — N12 Tubulo-interstitial nephritis, not specified as acute or chronic: Secondary | ICD-10-CM | POA: Diagnosis not present

## 2016-05-02 ENCOUNTER — Other Ambulatory Visit (HOSPITAL_COMMUNITY): Payer: Self-pay | Admitting: Internal Medicine

## 2016-05-02 DIAGNOSIS — K862 Cyst of pancreas: Secondary | ICD-10-CM

## 2016-05-03 ENCOUNTER — Ambulatory Visit (HOSPITAL_COMMUNITY)
Admission: RE | Admit: 2016-05-03 | Discharge: 2016-05-03 | Disposition: A | Discharge status: Home / Self Care | Payer: 59 | Source: Ambulatory Visit | Attending: Internal Medicine | Admitting: Internal Medicine

## 2016-05-03 ENCOUNTER — Other Ambulatory Visit (HOSPITAL_COMMUNITY): Payer: Self-pay | Admitting: Internal Medicine

## 2016-05-03 DIAGNOSIS — D3501 Benign neoplasm of right adrenal gland: Secondary | ICD-10-CM | POA: Diagnosis not present

## 2016-05-03 DIAGNOSIS — K862 Cyst of pancreas: Secondary | ICD-10-CM | POA: Diagnosis not present

## 2016-05-03 DIAGNOSIS — R935 Abnormal findings on diagnostic imaging of other abdominal regions, including retroperitoneum: Secondary | ICD-10-CM | POA: Diagnosis not present

## 2016-05-03 MED ORDER — GADOBENATE DIMEGLUMINE 529 MG/ML IV SOLN
15.0000 mL | Freq: Once | INTRAVENOUS | Status: AC | PRN
  Administered 2016-05-03: 15 mL via INTRAVENOUS

## 2016-05-18 MED FILL — LOSARTAN-HCTZ 50-12.5 MG TA: 50-12.5 | 90 days supply | Qty: 90 | Fill #1

## 2016-09-07 MED FILL — LOSARTAN-HCTZ 50-12.5 MG TA: 50-12.5 | 90 days supply | Qty: 90 | Fill #2

## 2016-10-10 ENCOUNTER — Other Ambulatory Visit: Payer: Self-pay | Admitting: Obstetrics & Gynecology

## 2016-10-10 DIAGNOSIS — Z1231 Encounter for screening mammogram for malignant neoplasm of breast: Secondary | ICD-10-CM

## 2016-10-12 ENCOUNTER — Ambulatory Visit (HOSPITAL_COMMUNITY)
Admission: RE | Admit: 2016-10-12 | Discharge: 2016-10-12 | Disposition: A | Discharge status: Home / Self Care | Payer: 59 | Source: Ambulatory Visit | Attending: Obstetrics & Gynecology | Admitting: Obstetrics & Gynecology

## 2016-10-12 DIAGNOSIS — Z1231 Encounter for screening mammogram for malignant neoplasm of breast: Secondary | ICD-10-CM | POA: Insufficient documentation

## 2016-11-23 DIAGNOSIS — I10 Essential (primary) hypertension: Secondary | ICD-10-CM | POA: Diagnosis not present

## 2016-11-28 DIAGNOSIS — K862 Cyst of pancreas: Secondary | ICD-10-CM | POA: Diagnosis not present

## 2016-11-28 DIAGNOSIS — I10 Essential (primary) hypertension: Secondary | ICD-10-CM | POA: Diagnosis not present

## 2016-11-28 DIAGNOSIS — Z8719 Personal history of other diseases of the digestive system: Secondary | ICD-10-CM | POA: Diagnosis not present

## 2016-11-28 DIAGNOSIS — J309 Allergic rhinitis, unspecified: Secondary | ICD-10-CM | POA: Diagnosis not present

## 2016-11-28 MED FILL — PANTOPRAZOLE SOD DR 40 MG T: 40 | 30 days supply | Qty: 30 | Fill #0

## 2016-11-28 MED FILL — MELOXICAM 15 MG TABLET: 15 | 30 days supply | Qty: 30 | Fill #0 | Status: TO

## 2016-11-28 MED FILL — LOSARTAN POTASSIUM 50 MG TA: 50 | 90 days supply | Qty: 90 | Fill #0

## 2016-12-14 ENCOUNTER — Other Ambulatory Visit: Payer: 59 | Admitting: Obstetrics & Gynecology

## 2016-12-25 ENCOUNTER — Encounter: Payer: Self-pay | Admitting: Obstetrics & Gynecology

## 2016-12-25 ENCOUNTER — Other Ambulatory Visit (HOSPITAL_COMMUNITY)
Admission: RE | Admit: 2016-12-25 | Discharge: 2016-12-25 | Disposition: A | Discharge status: Home / Self Care | Payer: 59 | Source: Ambulatory Visit | Attending: Obstetrics & Gynecology | Admitting: Obstetrics & Gynecology

## 2016-12-25 ENCOUNTER — Ambulatory Visit (INDEPENDENT_AMBULATORY_CARE_PROVIDER_SITE_OTHER): Payer: 59 | Admitting: Obstetrics & Gynecology

## 2016-12-25 VITALS — BP 132/78 | HR 64 | Ht 64.5 in | Wt 187.0 lb

## 2016-12-25 DIAGNOSIS — Z01419 Encounter for gynecological examination (general) (routine) without abnormal findings: Secondary | ICD-10-CM

## 2016-12-25 DIAGNOSIS — Z1212 Encounter for screening for malignant neoplasm of rectum: Secondary | ICD-10-CM

## 2016-12-25 DIAGNOSIS — Z1211 Encounter for screening for malignant neoplasm of colon: Secondary | ICD-10-CM | POA: Diagnosis not present

## 2016-12-25 NOTE — Progress Notes (Signed)
Subjective:     Elizabeth Roberson is a 66 y.o. female here for a routine exam.  No LMP recorded. Patient is postmenopausal. G3P3 Birth Control Method:  postmenopausal Menstrual Calendar(currently): amenorrheic  Current complaints: none.   Current acute medical issues:  none   Recent Gynecologic History No LMP recorded. Patient is postmenopausal. Last Pap: 2015,  normal Last mammogram: 10/12/2016,  normal  Past Medical History:  Diagnosis Date  . Allergy   . Hip pain, chronic 12/12/2015   Has bilateral hip for past 20 years when walks more than a few minutes, no loss of ROM  . Hypertension   . Vaginal Pap smear, abnormal   . Vasculitis ([place])     Past Surgical History:  Procedure Laterality Date  . BALLOON DILATION N/A 07/09/2014   Procedure: BALLOON DILATION;  Surgeon: Rogene Houston, MD;  [facility];  Service: Endoscopy;  Laterality: N/A;  . COLONOSCOPY    . COLONOSCOPY N/A 07/09/2014   Procedure: COLONOSCOPY;  Surgeon: Rogene Houston, MD;  [facility];  Service: Endoscopy;  Laterality: N/A;  730  . ESOPHAGOGASTRODUODENOSCOPY N/A 07/09/2014   Procedure: ESOPHAGOGASTRODUODENOSCOPY (EGD);  Surgeon: Rogene Houston, MD;  [facility];  Service: Endoscopy;  Laterality: N/A;  . laser surgery on cervix    . MALONEY DILATION N/A 07/09/2014   Procedure: Venia Minks DILATION;  Surgeon: Rogene Houston, MD;  [facility];  Service: Endoscopy;  Laterality: N/A;  . TUBAL LIGATION      OB History    Gravida Para Term Preterm AB Living   3 3       3    SAB TAB Ectopic Multiple Live Births                  Social History   Social History  . Marital status: Married    Spouse name: N/A  . Number of children: N/A  . Years of education: N/A   Social History Main Topics  . Smoking status: Never Smoker  . Smokeless tobacco: Never Used  . Alcohol use No  . Drug use: No  . Sexual activity: Yes    Birth control/ protection:  Post-menopausal, Surgical     Comment: tubal   Other Topics Concern  . None   Social History Narrative  . None    Family History  Problem Relation Age of Onset  . COPD Mother   . Alzheimer's disease Mother   . Hypertension Son      Current Outpatient Prescriptions:  .  albuterol (PROVENTIL HFA;VENTOLIN HFA) 108 (90 BASE) MCG/ACT inhaler, Inhale 2 puffs into the lungs every 6 (six) hours as needed for wheezing or shortness of breath., Disp: , Rfl:  .  ibuprofen (ADVIL,MOTRIN) 200 MG tablet, Take 400 mg by mouth daily as needed for mild pain or moderate pain., Disp: , Rfl:  .  losartan-hydrochlorothiazide (HYZAAR) 50-12.5 MG per tablet, Take 1 tablet by mouth daily., Disp: , Rfl: 5 .  levocetirizine (XYZAL) 5 MG tablet, Take 10 mg by mouth daily as needed for allergies. , Disp: , Rfl:  .  loratadine (ALLERGY) 10 MG tablet, Take 10 mg by mouth daily as needed for allergies or rhinitis., Disp: , Rfl:  .  montelukast (SINGULAIR) 10 MG tablet, Take 10 mg by mouth daily as needed (for allergies). , Disp: , Rfl:   Review of Systems  Review of Systems  Constitutional: Negative for fever, chills, weight loss, malaise/fatigue and diaphoresis.  HENT: Negative for hearing loss, ear pain, nosebleeds, congestion, sore throat, neck pain, tinnitus and ear discharge.   Eyes: Negative for blurred vision, double vision, photophobia, pain, discharge and redness.  Respiratory: Negative for cough, hemoptysis, sputum production, shortness of breath, wheezing and stridor.   Cardiovascular: Negative for chest pain, palpitations, orthopnea, claudication, leg swelling and PND.  Gastrointestinal: negative for abdominal pain. Negative for heartburn, nausea, vomiting, diarrhea, constipation, blood in stool and melena.  Genitourinary: Negative for dysuria, urgency, frequency, hematuria and flank pain.  Musculoskeletal: Negative for myalgias, back pain, joint pain and falls.  Skin: Negative for itching and rash.   Neurological: Negative for dizziness, tingling, tremors, sensory change, speech change, focal weakness, seizures, loss of consciousness, weakness and headaches.  Endo/Heme/Allergies: Negative for environmental allergies and polydipsia. Does not bruise/bleed easily.  Psychiatric/Behavioral: Negative for depression, suicidal ideas, hallucinations, memory loss and substance abuse. The patient is not nervous/anxious and does not have insomnia.        Objective:  Blood pressure 132/78, pulse 64, height 5' 4.5" (1.638 m), weight 187 lb (84.8 kg).   Physical Exam  Vitals reviewed. Constitutional: She is oriented to person, place, and time. She appears well-developed and well-nourished.  HENT:  Head: Normocephalic and atraumatic.        Right Ear: External ear normal.  Left Ear: External ear normal.  Nose: Nose normal.  Mouth/Throat: Oropharynx is clear and moist.  Eyes: Conjunctivae and EOM are normal. Pupils are equal, round, and reactive to light. Right eye exhibits no discharge. Left eye exhibits no discharge. No scleral icterus.  Neck: Normal range of motion. Neck supple. No tracheal deviation present. No thyromegaly present.  Cardiovascular: Normal rate, regular rhythm, normal heart sounds and intact distal pulses.  Exam reveals no gallop and no friction rub.   No murmur heard. Respiratory: Effort normal and breath sounds normal. No respiratory distress. She has no wheezes. She has no rales. She exhibits no tenderness.  GI: Soft. Bowel sounds are normal. She exhibits no distension and no mass. There is no tenderness. There is no rebound and no guarding.  Genitourinary:  Breasts no masses skin changes or nipple changes bilaterally      Vulva is normal without lesions Vagina is pink moist without discharge Cervix normal in appearance and pap is done Uterus is normal size shape and contour Adnexa is negative with normal sized ovaries  {Rectal    hemoccult negative, normal tone, no masses   Musculoskeletal: Normal range of motion. She exhibits no edema and no tenderness.  Neurological: She is alert and oriented to person, place, and time. She has normal reflexes. She displays normal reflexes. No cranial nerve deficit. She exhibits normal muscle tone. Coordination normal.  Skin: Skin is warm and dry. No rash noted. No erythema. No pallor.  Psychiatric: She has a normal mood and affect. Her behavior is normal. Judgment and thought content normal.       Medications Ordered at today's visit: No orders of the defined types were placed in this encounter.   Other orders placed at today's visit: No orders of the defined types were placed in this encounter.     Assessment:    Healthy female exam.    Plan:    Mammogram ordered. Follow up in: 2 years. return if needed     Return in about 2 years (around 12/26/2018) for yearly, with Dr Elonda Husky.

## 2016-12-25 NOTE — Addendum Note (Signed)
Addended by: Traci Sermon A on: 12/25/2016 12:02 PM   Modules accepted: Orders

## 2016-12-27 LAB — CYTOLOGY - PAP
DIAGNOSIS: NEGATIVE
HPV (WINDOPATH): NOT DETECTED

## 2017-03-15 MED FILL — LOSARTAN POTASSIUM 50 MG TA: 50 | 90 days supply | Qty: 90 | Fill #1

## 2017-05-22 DIAGNOSIS — K862 Cyst of pancreas: Secondary | ICD-10-CM | POA: Diagnosis not present

## 2017-05-22 DIAGNOSIS — I10 Essential (primary) hypertension: Secondary | ICD-10-CM | POA: Diagnosis not present

## 2017-05-23 ENCOUNTER — Other Ambulatory Visit (HOSPITAL_COMMUNITY): Payer: Self-pay | Admitting: Internal Medicine

## 2017-05-23 DIAGNOSIS — K861 Other chronic pancreatitis: Secondary | ICD-10-CM

## 2017-05-23 DIAGNOSIS — K862 Cyst of pancreas: Secondary | ICD-10-CM

## 2017-07-08 ENCOUNTER — Ambulatory Visit (HOSPITAL_COMMUNITY): Payer: 59

## 2017-07-18 MED FILL — LOSARTAN POTASSIUM 50 MG TA: 50 | 90 days supply | Qty: 90 | Fill #0

## 2017-07-25 MED FILL — SHIPPING COST: 1 days supply | Qty: 1 | Fill #0

## 2017-07-25 MED FILL — MELOXICAM 15 MG TABLET: 15 | 30 days supply | Qty: 30 | Fill #0

## 2017-08-19 DIAGNOSIS — J060 Acute laryngopharyngitis: Secondary | ICD-10-CM | POA: Diagnosis not present

## 2017-08-19 DIAGNOSIS — R05 Cough: Secondary | ICD-10-CM | POA: Diagnosis not present

## 2017-08-19 MED FILL — SHIPPING COST: 1 days supply | Qty: 1 | Fill #1

## 2017-08-19 MED FILL — MONTELUKAST SOD 10 MG TAB: 10 | 90 days supply | Qty: 90 | Fill #0

## 2017-09-03 DIAGNOSIS — R1310 Dysphagia, unspecified: Secondary | ICD-10-CM | POA: Diagnosis not present

## 2017-09-03 DIAGNOSIS — R05 Cough: Secondary | ICD-10-CM | POA: Diagnosis not present

## 2017-09-03 DIAGNOSIS — Z6832 Body mass index (BMI) 32.0-32.9, adult: Secondary | ICD-10-CM | POA: Diagnosis not present

## 2017-09-03 DIAGNOSIS — K219 Gastro-esophageal reflux disease without esophagitis: Secondary | ICD-10-CM | POA: Diagnosis not present

## 2017-09-03 MED FILL — SHIPPING COST: 1 days supply | Qty: 1 | Fill #2

## 2017-09-03 MED FILL — PANTOPRAZOLE SOD DR 40 MG T: 40 | 90 days supply | Qty: 90 | Fill #0

## 2017-09-06 ENCOUNTER — Other Ambulatory Visit (HOSPITAL_COMMUNITY): Payer: Self-pay | Admitting: Internal Medicine

## 2017-09-06 ENCOUNTER — Ambulatory Visit (HOSPITAL_COMMUNITY)
Admission: RE | Admit: 2017-09-06 | Discharge: 2017-09-06 | Disposition: A | Discharge status: Home / Self Care | Payer: 59 | Source: Ambulatory Visit | Attending: Internal Medicine | Admitting: Internal Medicine

## 2017-09-06 DIAGNOSIS — K8689 Other specified diseases of pancreas: Secondary | ICD-10-CM | POA: Diagnosis not present

## 2017-09-06 DIAGNOSIS — K861 Other chronic pancreatitis: Secondary | ICD-10-CM | POA: Insufficient documentation

## 2017-09-06 DIAGNOSIS — H10413 Chronic giant papillary conjunctivitis, bilateral: Secondary | ICD-10-CM | POA: Diagnosis not present

## 2017-09-06 DIAGNOSIS — H2513 Age-related nuclear cataract, bilateral: Secondary | ICD-10-CM | POA: Diagnosis not present

## 2017-09-06 DIAGNOSIS — K862 Cyst of pancreas: Secondary | ICD-10-CM | POA: Diagnosis not present

## 2017-09-06 DIAGNOSIS — H04123 Dry eye syndrome of bilateral lacrimal glands: Secondary | ICD-10-CM | POA: Diagnosis not present

## 2017-09-06 LAB — POCT I-STAT CREATININE: Creatinine, Ser: 0.9 mg/dL (ref 0.44–1.00)

## 2017-09-06 MED ORDER — GADOBENATE DIMEGLUMINE 529 MG/ML IV SOLN
18.0000 mL | Freq: Once | INTRAVENOUS | Status: AC | PRN
  Administered 2017-09-06: 18 mL via INTRAVENOUS

## 2017-09-06 MED ORDER — SODIUM CHLORIDE 0.9 % IV SOLN
INTRAVENOUS | Status: AC
  Filled 2017-09-06: qty 500

## 2017-09-10 ENCOUNTER — Encounter (INDEPENDENT_AMBULATORY_CARE_PROVIDER_SITE_OTHER): Payer: Self-pay | Admitting: Internal Medicine

## 2017-09-10 ENCOUNTER — Encounter (INDEPENDENT_AMBULATORY_CARE_PROVIDER_SITE_OTHER): Payer: Self-pay | Admitting: *Deleted

## 2017-09-10 ENCOUNTER — Ambulatory Visit (INDEPENDENT_AMBULATORY_CARE_PROVIDER_SITE_OTHER): Payer: 59 | Admitting: Internal Medicine

## 2017-09-10 DIAGNOSIS — R1319 Other dysphagia: Secondary | ICD-10-CM

## 2017-09-10 DIAGNOSIS — R1310 Dysphagia, unspecified: Secondary | ICD-10-CM

## 2017-09-10 HISTORY — DX: Dysphagia, unspecified: R13.10

## 2017-09-10 NOTE — Progress Notes (Signed)
Subjective:    Patient ID: Elizabeth Roberson, female    DOB: [birth year], 67 y.o.   MRN: [id number]  HPI  Referred by Dr. Allyn Kenner for esophageal stricture. Having some dysphagia. She says she has choked x 2.  Symptoms over this past 6 weeks. Rice and meats bother her. She has to eat small bites. Her appetite is good. No weight loss. BMs are normal. No melena or BRRB.  Has been treated for cough with Azithromycin, and a Z pack 4 weeks ago.    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 Mild changes of reflux esophagitis limited to GE junction. Small sliding hiatal hernia. Erosive antral gastritis. GE junction was dilated with balloon dilator to 18 mm no mucosal disruption induced. Esophagus is also dilated by passing 54 French Maloney dilator and once again no mucosal disruption noted. Antral biopsy taken for CLOtest and esophageal biopsy taken to rule out eosinophilic esophagitis.   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    Colonoscopy:  Average risk  Small polyp ablated via cold biopsy from ascending colon. External hemorrhoids with 3 small anal papillae.  Biopsy:   Esophageal biopsy negative for EoE. Patient had single small polyp removed and is tubular adenoma. Review of Systems   Past Medical History:  Diagnosis Date  . Allergy   . Dysphagia 09/10/2017  . Hip pain, chronic 12/12/2015   Has bilateral hip for past 20 years when walks more than a few minutes, no loss of ROM  . Hypertension   . Vaginal Pap smear, abnormal   . Vasculitis ([place])     Past Surgical History:  Procedure Laterality Date  . BALLOON DILATION N/A 07/09/2014   Procedure: BALLOON DILATION;  Surgeon: Rogene Houston, MD;  [facility];  Service: Endoscopy;  Laterality: N/A;  . COLONOSCOPY    . COLONOSCOPY N/A 07/09/2014   Procedure: COLONOSCOPY;  Surgeon: Rogene Houston, MD;  [facility];  Service: Endoscopy;  Laterality: N/A;  730  . ESOPHAGOGASTRODUODENOSCOPY N/A 07/09/2014   Procedure: ESOPHAGOGASTRODUODENOSCOPY (EGD);  Surgeon: Rogene Houston, MD;  [facility];  Service: Endoscopy;  Laterality: N/A;  . laser surgery on cervix    . MALONEY DILATION N/A 07/09/2014   Procedure: Venia Minks DILATION;  Surgeon: Rogene Houston, MD;  [facility];  Service: Endoscopy;  Laterality: N/A;  . TUBAL LIGATION      No Known Allergies  Current Outpatient Medications on File Prior to Visit  Medication Sig Dispense Refill  . albuterol (PROVENTIL HFA;VENTOLIN HFA) 108 (90 BASE) MCG/ACT inhaler Inhale 2 puffs into the lungs every 6 (six) hours as needed for wheezing or shortness of breath.    . benzonatate (TESSALON) 200 MG capsule Take 200 mg by mouth 3 (three) times daily as needed for cough.    Marland Kitchen ibuprofen (ADVIL,MOTRIN) 200 MG tablet Take 400 mg by mouth daily as needed for mild pain or moderate pain.    Marland Kitchen levocetirizine (XYZAL) 5 MG tablet Take 10 mg by mouth daily as needed for allergies.     Marland Kitchen losartan-hydrochlorothiazide (HYZAAR) 50-12.5 MG per tablet Take 1 tablet by mouth daily.  5  . montelukast (SINGULAIR) 10 MG tablet Take 10 mg by mouth daily as needed (for allergies).     . pantoprazole (PROTONIX) 40 MG tablet Take 40 mg by mouth  daily.     No current facility-administered medications on file prior to visit.         Objective:   Physical Exam Blood pressure 126/82, pulse 76, temperature 97.7 F (36.5 C), height 5\' 5"  (1.651 m), weight 193 lb 6.4 oz (87.7 kg). Alert and oriented. Skin warm and dry. Oral mucosa is moist.   . Sclera anicteric, conjunctivae is pink. Thyroid not enlarged. No cervical lymphadenopathy. Lungs clear. Heart regular rate and rhythm.  Abdomen is soft. Bowel sounds are positive. No hepatomegaly. No abdominal masses felt. No tenderness.  No edema to  lower extremities.          Assessment & Plan:  Solid food dysphagia: will get an esophagram to see if she has a stricutre first before proceeding with an EGD

## 2017-09-10 NOTE — Patient Instructions (Signed)
DG esophagram.   

## 2017-09-13 ENCOUNTER — Ambulatory Visit (HOSPITAL_COMMUNITY)
Admission: RE | Admit: 2017-09-13 | Discharge: 2017-09-13 | Disposition: A | Discharge status: Home / Self Care | Payer: 59 | Source: Ambulatory Visit | Attending: Internal Medicine | Admitting: Internal Medicine

## 2017-09-13 DIAGNOSIS — R1310 Dysphagia, unspecified: Secondary | ICD-10-CM | POA: Diagnosis not present

## 2017-09-13 DIAGNOSIS — R933 Abnormal findings on diagnostic imaging of other parts of digestive tract: Secondary | ICD-10-CM | POA: Insufficient documentation

## 2017-09-13 DIAGNOSIS — K449 Diaphragmatic hernia without obstruction or gangrene: Secondary | ICD-10-CM | POA: Diagnosis not present

## 2017-09-13 DIAGNOSIS — R1319 Other dysphagia: Secondary | ICD-10-CM

## 2017-09-16 ENCOUNTER — Telehealth (INDEPENDENT_AMBULATORY_CARE_PROVIDER_SITE_OTHER): Payer: Self-pay | Admitting: *Deleted

## 2017-09-16 ENCOUNTER — Telehealth (INDEPENDENT_AMBULATORY_CARE_PROVIDER_SITE_OTHER): Payer: Self-pay | Admitting: Internal Medicine

## 2017-09-16 ENCOUNTER — Other Ambulatory Visit (INDEPENDENT_AMBULATORY_CARE_PROVIDER_SITE_OTHER): Payer: Self-pay | Admitting: Internal Medicine

## 2017-09-16 DIAGNOSIS — R05 Cough: Secondary | ICD-10-CM | POA: Diagnosis not present

## 2017-09-16 DIAGNOSIS — R1319 Other dysphagia: Secondary | ICD-10-CM

## 2017-09-16 DIAGNOSIS — R1310 Dysphagia, unspecified: Secondary | ICD-10-CM

## 2017-09-16 DIAGNOSIS — H04209 Unspecified epiphora, unspecified lacrimal gland: Secondary | ICD-10-CM | POA: Diagnosis not present

## 2017-09-16 NOTE — Telephone Encounter (Signed)
Ann, EGD/ED. Put out at least 4 weeks. thanks

## 2017-09-16 NOTE — Telephone Encounter (Signed)
I will call her back.

## 2017-09-16 NOTE — Telephone Encounter (Signed)
Patient returned a call

## 2017-09-17 ENCOUNTER — Encounter (INDEPENDENT_AMBULATORY_CARE_PROVIDER_SITE_OTHER): Payer: Self-pay | Admitting: *Deleted

## 2017-09-17 DIAGNOSIS — R1319 Other dysphagia: Secondary | ICD-10-CM | POA: Insufficient documentation

## 2017-09-17 DIAGNOSIS — R1310 Dysphagia, unspecified: Secondary | ICD-10-CM | POA: Insufficient documentation

## 2017-09-17 NOTE — Telephone Encounter (Signed)
err

## 2017-09-17 NOTE — Telephone Encounter (Signed)
EGD/ED sch'd 10/04/17 at 855 (750), patient aware, instructions mailed

## 2017-10-04 ENCOUNTER — Encounter (HOSPITAL_COMMUNITY)
Admission: RE | Disposition: A | Discharge status: Home / Self Care | Payer: Self-pay | Source: Ambulatory Visit | Attending: Internal Medicine

## 2017-10-04 ENCOUNTER — Ambulatory Visit (HOSPITAL_COMMUNITY)
Admission: RE | Admit: 2017-10-04 | Discharge: 2017-10-04 | Disposition: A | Discharge status: Home / Self Care | Payer: 59 | Source: Ambulatory Visit | Attending: Internal Medicine | Admitting: Internal Medicine

## 2017-10-04 ENCOUNTER — Encounter (HOSPITAL_COMMUNITY): Payer: Self-pay | Admitting: *Deleted

## 2017-10-04 ENCOUNTER — Other Ambulatory Visit: Payer: Self-pay

## 2017-10-04 DIAGNOSIS — K222 Esophageal obstruction: Secondary | ICD-10-CM | POA: Insufficient documentation

## 2017-10-04 DIAGNOSIS — K259 Gastric ulcer, unspecified as acute or chronic, without hemorrhage or perforation: Secondary | ICD-10-CM | POA: Insufficient documentation

## 2017-10-04 DIAGNOSIS — K219 Gastro-esophageal reflux disease without esophagitis: Secondary | ICD-10-CM | POA: Diagnosis not present

## 2017-10-04 DIAGNOSIS — R1310 Dysphagia, unspecified: Secondary | ICD-10-CM | POA: Diagnosis not present

## 2017-10-04 DIAGNOSIS — H04209 Unspecified epiphora, unspecified lacrimal gland: Secondary | ICD-10-CM | POA: Diagnosis not present

## 2017-10-04 DIAGNOSIS — Z6832 Body mass index (BMI) 32.0-32.9, adult: Secondary | ICD-10-CM | POA: Diagnosis not present

## 2017-10-04 DIAGNOSIS — R05 Cough: Secondary | ICD-10-CM | POA: Diagnosis not present

## 2017-10-04 DIAGNOSIS — J060 Acute laryngopharyngitis: Secondary | ICD-10-CM | POA: Diagnosis not present

## 2017-10-04 DIAGNOSIS — Z0000 Encounter for general adult medical examination without abnormal findings: Secondary | ICD-10-CM | POA: Diagnosis not present

## 2017-10-04 DIAGNOSIS — Z79899 Other long term (current) drug therapy: Secondary | ICD-10-CM | POA: Diagnosis not present

## 2017-10-04 DIAGNOSIS — K210 Gastro-esophageal reflux disease with esophagitis: Secondary | ICD-10-CM | POA: Diagnosis not present

## 2017-10-04 DIAGNOSIS — K449 Diaphragmatic hernia without obstruction or gangrene: Secondary | ICD-10-CM | POA: Diagnosis not present

## 2017-10-04 DIAGNOSIS — I10 Essential (primary) hypertension: Secondary | ICD-10-CM | POA: Diagnosis not present

## 2017-10-04 DIAGNOSIS — R1319 Other dysphagia: Secondary | ICD-10-CM

## 2017-10-04 DIAGNOSIS — M20012 Mallet finger of left finger(s): Secondary | ICD-10-CM | POA: Diagnosis not present

## 2017-10-04 DIAGNOSIS — K3189 Other diseases of stomach and duodenum: Secondary | ICD-10-CM | POA: Diagnosis not present

## 2017-10-04 DIAGNOSIS — R1314 Dysphagia, pharyngoesophageal phase: Secondary | ICD-10-CM | POA: Diagnosis not present

## 2017-10-04 HISTORY — PX: ESOPHAGOGASTRODUODENOSCOPY: SHX5428

## 2017-10-04 HISTORY — PX: BIOPSY: SHX5522

## 2017-10-04 HISTORY — PX: ESOPHAGEAL DILATION: SHX303

## 2017-10-04 SURGERY — EGD (ESOPHAGOGASTRODUODENOSCOPY)
Anesthesia: Moderate Sedation

## 2017-10-04 MED ORDER — MIDAZOLAM HCL 5 MG/5ML IJ SOLN
INTRAMUSCULAR | Status: DC | PRN
  Administered 2017-10-04 (×3): 2 mg via INTRAVENOUS
  Administered 2017-10-04: 1 mg via INTRAVENOUS
  Administered 2017-10-04: 2 mg via INTRAVENOUS

## 2017-10-04 MED ORDER — MIDAZOLAM HCL 5 MG/5ML IJ SOLN
INTRAMUSCULAR | Status: AC
  Filled 2017-10-04: qty 10

## 2017-10-04 MED ORDER — LIDOCAINE VISCOUS 2 % MT SOLN
OROMUCOSAL | Status: AC
  Filled 2017-10-04: qty 15

## 2017-10-04 MED ORDER — MEPERIDINE HCL 50 MG/ML IJ SOLN
INTRAMUSCULAR | Status: DC | PRN
  Administered 2017-10-04 (×3): 25 mg via INTRAVENOUS

## 2017-10-04 MED ORDER — STERILE WATER FOR IRRIGATION IR SOLN
Status: DC | PRN
  Administered 2017-10-04: 09:00:00

## 2017-10-04 MED ORDER — PANTOPRAZOLE SODIUM 40 MG PO TBEC: 40.0000 mg | DELAYED_RELEASE_TABLET | Freq: Two times a day (BID) | ORAL | 2 refills | Status: AC

## 2017-10-04 MED ORDER — MEPERIDINE HCL 50 MG/ML IJ SOLN
INTRAMUSCULAR | Status: AC
  Filled 2017-10-04: qty 1

## 2017-10-04 MED ORDER — LIDOCAINE VISCOUS 2 % MT SOLN
OROMUCOSAL | Status: DC | PRN
  Administered 2017-10-04: 4 mL via OROMUCOSAL

## 2017-10-04 MED ORDER — SODIUM CHLORIDE 0.9 % IV SOLN
INTRAVENOUS | Status: DC
  Administered 2017-10-04: 08:00:00 via INTRAVENOUS

## 2017-10-04 MED ORDER — MEPERIDINE HCL 50 MG/ML IJ SOLN
INTRAMUSCULAR | Status: DC
Start: 2017-10-04 — End: 2017-10-04
  Filled 2017-10-04: qty 1

## 2017-10-04 NOTE — Discharge Instructions (Signed)
Keep ibuprofen use to minimum. Do not take meloxicam while on ibuprofen. Increase pantoprazole to 40 mg by mouth twice daily for 12 weeks and thereafter once daily. Resume usual diet. No driving for 24 hours. Physician will call with biopsy results.  Esophageal Dilatation Esophageal dilatation is a procedure to open a blocked or narrowed part of the esophagus. The esophagus is the long tube in your throat that carries food and liquid from your mouth to your stomach. The procedure is also called esophageal dilation. You may need this procedure if you have a buildup of scar tissue in your esophagus that makes it difficult, painful, or even impossible to swallow. This can be caused by gastroesophageal reflux disease (GERD). In rare cases, people need this procedure because they have cancer of the esophagus or a problem with the way food moves through the esophagus. Sometimes you may need to have another dilatation to enlarge the opening of the esophagus gradually. Tell a health care provider about:  Any allergies you have.  All medicines you are taking, including vitamins, herbs, eye drops, creams, and over-the-counter medicines.  Any problems you or family members have had with anesthetic medicines.  Any blood disorders you have.  Any surgeries you have had.  Any medical conditions you have.  Any antibiotic medicines you are required to take before dental procedures. What are the risks? Generally, this is a safe procedure. However, problems can occur and include:  Bleeding from a tear in the lining of the esophagus.  A hole (perforation) in the esophagus.  What happens before the procedure?  Do not eat or drink anything after midnight on the night before the procedure or as directed by your health care provider.  Ask your health care provider about changing or stopping your regular medicines. This is especially important if you are taking diabetes medicines or blood  thinners.  Plan to have someone take you home after the procedure. What happens during the procedure?  You will be given a medicine that makes you relaxed and sleepy (sedative).  A medicine may be sprayed or gargled to numb the back of the throat.  Your health care provider can use various instruments to do an esophageal dilatation. During the procedure, the instrument used will be placed in your mouth and passed down into your esophagus. Options include: ? Simple dilators. This instrument is carefully placed in the esophagus to stretch it. ? Guided wire bougies. In this method, a flexible tube (endoscope) is used to insert a wire into the esophagus. The dilator is passed over this wire to enlarge the esophagus. Then the wire is removed. ? Balloon dilators. An endoscope with a small balloon at the end is passed down into the esophagus. Inflating the balloon gently stretches the esophagus and opens it up. What happens after the procedure?  Your blood pressure, heart rate, breathing rate, and blood oxygen level will be monitored often until the medicines you were given have worn off.  Your throat may feel slightly sore and will probably still feel numb. This will improve slowly over time.  You will not be allowed to eat or drink until the throat numbness has resolved.  If this is a same-day procedure, you may be allowed to go home once you have been able to drink, urinate, and sit on the edge of the bed without nausea or dizziness.  If this is a same-day procedure, you should have a friend or family member with you for the next 24 hours  after the procedure. This information is not intended to replace advice given to you by your health care provider. Make sure you discuss any questions you have with your health care provider. Document Released: 08/30/2005 Document Revised: 12/15/2015 Document Reviewed: 11/18/2013 Elsevier Interactive Patient Education  2018 [place]. Gastrointestinal  Endoscopy, Care After Refer to this sheet in the next few weeks. These instructions provide you with information about caring for yourself after your procedure. Your health care provider may also give you more specific instructions. Your treatment has been planned according to current medical practices, but problems sometimes occur. Call your health care provider if you have any problems or questions after your procedure. What can I expect after the procedure? After your procedure, it is common to feel:  Bloated.  Soreness in your throat.  Sleepy.  Follow these instructions at home:  Do not drive for 24 hours if you received a if you received a medicine to help you relax (sedative).  Avoid drinking warm beverages and alcohol for the first 24 hours after the procedure.  Take over-the-counter and prescription medicines only as told by your health care provider.  Drink enough fluids to keep your urine clear or pale yellow.  If you feel bloated, try going for a walk. Walking may help the feeling go away.  If your throat is sore, try gargling with salt water. Get help right away if:  You have severe nausea or vomiting.  You have severe abdominal pain, abdominal cramps that last longer than 6 hours, or abdominal swelling.  You have severe shoulder or back pain.  You have trouble swallowing.  You have shortness of breath, your breathing is shallow, or you breathing is faster than normal.  You have a fever.  Your heart is beating very fast.  You vomit blood or material that looks like coffee grounds.  You have bloody, black, or tarry stools. This information is not intended to replace advice given to you by your health care provider. Make sure you discuss any questions you have with your health care provider. Document Released: 02/21/2004 Document Revised: 05/16/2016 Document Reviewed: 05/01/2015 Elsevier Interactive Patient Education  2018 [employer].

## 2017-10-04 NOTE — H&P (Signed)
Elizabeth Roberson is an 67 y.o. female.   Chief Complaint: Patient is here for EGD and ED. HPI: Patient is 67 year old Caucasian female who presents with 76-month history of dysphagia to solids.  She had 2 episodes of food impaction relieved with regurgitation.  She does not have heartburn very often.  She was seen by Dr. Delphina Cahill few weeks ago and begun on pantoprazole.  She denies nausea vomiting abdominal pain or melena.  Last EGD was in December 2015 when she presented with dysphagia.  Esophageal mucosal appearance suggested eosinophilic esophagitis but biopsy was negative.  She responded to esophageal dilation. She takes meloxicam couple of times a month.  Past Medical History:  Diagnosis Date  . Allergy   . Dysphagia 09/10/2017  . Hip pain, chronic 12/12/2015   Has bilateral hip for past 20 years when walks more than a few minutes, no loss of ROM  . Hypertension   . Vaginal Pap smear, abnormal   . Vasculitis ([place])     Past Surgical History:  Procedure Laterality Date  . BALLOON DILATION N/A 07/09/2014   Procedure: BALLOON DILATION;  Surgeon: Rogene Houston, MD;  [facility];  Service: Endoscopy;  Laterality: N/A;  . COLONOSCOPY    . COLONOSCOPY N/A 07/09/2014   Procedure: COLONOSCOPY;  Surgeon: Rogene Houston, MD;  [facility];  Service: Endoscopy;  Laterality: N/A;  730  . ESOPHAGOGASTRODUODENOSCOPY N/A 07/09/2014   Procedure: ESOPHAGOGASTRODUODENOSCOPY (EGD);  Surgeon: Rogene Houston, MD;  [facility];  Service: Endoscopy;  Laterality: N/A;  . laser surgery on cervix    . MALONEY DILATION N/A 07/09/2014   Procedure: Venia Minks DILATION;  Surgeon: Rogene Houston, MD;  [facility];  Service: Endoscopy;  Laterality: N/A;  . TUBAL LIGATION      Family History  Problem Relation Age of Onset  . COPD Mother   . Alzheimer's disease Mother   . Hypertension Son    Social History:  reports that  has never smoked. she has never used  smokeless tobacco. She reports that she does not drink alcohol or use drugs.  Allergies: No Known Allergies  Medications Prior to Admission  Medication Sig Dispense Refill  . acetaminophen (TYLENOL) 500 MG tablet Take 500 mg by mouth every 6 (six) hours as needed for moderate pain or headache.    . albuterol (PROVENTIL HFA;VENTOLIN HFA) 108 (90 BASE) MCG/ACT inhaler Inhale 2 puffs into the lungs every 6 (six) hours as needed for wheezing or shortness of breath.    . cetirizine (ZYRTEC) 10 MG tablet Take 10 mg by mouth daily.    Marland Kitchen ibuprofen (ADVIL,MOTRIN) 200 MG tablet Take 400 mg by mouth every 6 (six) hours as needed for mild pain or moderate pain.     Marland Kitchen ketotifen (ALAWAY) 0.025 % ophthalmic solution Place 1 drop into both eyes 2 (two) times daily as needed (for dry eyes).    Marland Kitchen losartan (COZAAR) 50 MG tablet Take 50 mg by mouth daily.  1  . meloxicam (MOBIC) 15 MG tablet Take 15 mg by mouth daily as needed for pain.    . montelukast (SINGULAIR) 10 MG tablet Take 10 mg by mouth daily as needed (for allergies).     . pantoprazole (PROTONIX) 40 MG tablet Take 40 mg by mouth daily.      No results found for this or any previous visit (from the past 48 hour(s)). No results found.  ROS  Blood pressure 133/90,  pulse 64, temperature 98 F (36.7 C), temperature source Oral, resp. rate 15, height 5\' 5"  (1.651 m), weight 193 lb (87.5 kg), SpO2 100 %. Physical Exam  Constitutional: She appears well-developed and well-nourished.  HENT:  Mouth/Throat: Oropharynx is clear and moist.  Eyes: Conjunctivae are normal. No scleral icterus.  Neck: No thyromegaly present.  Cardiovascular: Normal rate, regular rhythm and normal heart sounds.  No murmur heard. Respiratory: Effort normal and breath sounds normal.  GI: Soft. She exhibits no distension and no mass. There is no tenderness.  Musculoskeletal: She exhibits no edema.  Lymphadenopathy:    She has no cervical adenopathy.  Neurological: She is  alert.  Skin: Skin is warm and dry.     Assessment/Plan Solid food dysphagia. EGD with ED.  Hildred Laser, MD 10/04/2017, 8:52 AM

## 2017-10-04 NOTE — Op Note (Signed)
[facility] Patient Name: Elizabeth Roberson Procedure Date: 10/04/2017 8:30 AM MRN: [id number] Date of Birth: 05/21/1951 Attending MD: Hildred Laser , MD CSN: [id number] Age: 67 Admit Type: Outpatient Procedure:                Upper GI endoscopy Indications:              Esophageal dysphagia Providers:                Hildred Laser, MD, Jeanann Lewandowsky. Sharon Seller, RN, Nelma Rothman, Technician Referring MD:             Delphina Cahill, MD Medicines:                Lidocaine spray, Meperidine 75 mg IV, Midazolam 9                            mg IV Complications:            No immediate complications. Estimated Blood Loss:     Estimated blood loss was minimal. Procedure:                Pre-Anesthesia Assessment:                           - Prior to the procedure, a History and Physical                            was performed, and patient medications and                            allergies were reviewed. The patient's tolerance of                            previous anesthesia was also reviewed. The risks                            and benefits of the procedure and the sedation                            options and risks were discussed with the patient.                            All questions were answered, and informed consent                            was obtained. Prior Anticoagulants: The patient                            last took ibuprofen 3 days prior to the procedure.                            ASA Grade Assessment: II - A patient with mild  systemic disease. After reviewing the risks and                            benefits, the patient was deemed in satisfactory                            condition to undergo the procedure.                           After obtaining informed consent, the endoscope was                            passed under direct vision. Throughout the                            procedure, the patient's blood  pressure, pulse, and                            oxygen saturations were monitored continuously. The                            EG-[id number] ([id number]) scope was introduced through the                            mouth, and advanced to the second part of duodenum.                            The upper GI endoscopy was accomplished without                            difficulty. The patient tolerated the procedure                            well. Scope In: 9:03:58 AM Scope Out: 9:17:13 AM Total Procedure Duration: 0 hours 13 minutes 15 seconds  Findings:      The examined esophagus was normal.      A non-obstructing Schatzki ring (acquired) was found at the       gastroesophageal junction. The scope was withdrawn. Dilation was       performed with a Maloney dilator with mild resistance at 45 Fr. The       dilation site was examined following endoscope reinsertion and showed       complete resolution of luminal narrowing and no perforation.      A 2 cm hiatal hernia was present.      The stomach was normal.      One non-bleeding superficial gastric ulcer with no stigmata of bleeding       was found in the prepyloric region of the stomach. The lesion was 4 mm       in largest dimension. Biopsies were taken with a cold forceps for       histology.      The exam of the stomach was otherwise normal.      The duodenal bulb and second portion of the duodenum were normal. Impression:               -  Normal esophagus.                           - Non-obstructing Schatzki ring. Dilated.                           - 2 cm hiatal hernia.                           - Normal stomach.                           - Non-bleeding gastric ulcer with no stigmata of                            bleeding. Biopsied.                           - Normal duodenal bulb and second portion of the                            duodenum. Moderate Sedation:      Moderate (conscious) sedation was administered by the endoscopy nurse        and supervised by the endoscopist. The following parameters were       monitored: oxygen saturation, heart rate, blood pressure, CO2       capnography and response to care. Total physician intraservice time was       19 minutes. Recommendation:           - Patient has a contact number available for                            emergencies. The signs and symptoms of potential                            delayed complications were discussed with the                            patient. Return to normal activities tomorrow.                            Written discharge instructions were provided to the                            patient.                           - Resume previous diet today.                           - Continue present medications.                           - Await pathology results.                           - Increase pantoprazole to 40 mg p.o. twice daily                           -  Keep NSAID use to minimum. Procedure Code(s):        --- Professional ---                           [phone], Esophagogastroduodenoscopy, flexible,                            transoral; with biopsy, single or multiple                           43450, Dilation of esophagus, by unguided sound or                            bougie, single or multiple passes                           99152, Moderate sedation services provided by the                            same physician or other qualified health care                            professional performing the diagnostic or                            therapeutic service that the sedation supports,                            requiring the presence of an independent trained                            observer to assist in the monitoring of the                            patient's level of consciousness and physiological                            status; initial 15 minutes of intraservice time,                            patient age 47 years or  older Diagnosis Code(s):        --- Professional ---                           K22.2, Esophageal obstruction                           K44.9, Diaphragmatic hernia without obstruction or                            gangrene                           K25.9, Gastric ulcer, unspecified as acute or  chronic, without hemorrhage or perforation                           R13.14, Dysphagia, pharyngoesophageal phase CPT copyright 2016 American Medical Association. All rights reserved. The codes documented in this report are preliminary and upon coder review may  be revised to meet current compliance requirements. Hildred Laser, MD Hildred Laser, MD 10/04/2017 9:29:30 AM This report has been signed electronically. Number of Addenda: 0

## 2017-10-08 ENCOUNTER — Telehealth (INDEPENDENT_AMBULATORY_CARE_PROVIDER_SITE_OTHER): Payer: Self-pay | Admitting: Internal Medicine

## 2017-10-08 NOTE — Telephone Encounter (Signed)
Patient returned call

## 2017-10-09 NOTE — Telephone Encounter (Signed)
Will discuss with Dr.Rehman. Below is the result from patient's pathology report. Diagnosis Stomach, biopsy - REACTIVE GASTROPATHY. - NEGATIVE FOR HELICOBACTER PYLORI. - NO INTESTINAL METAPLASIA, DYSPLASIA, OR MALIGNANCY. Microscopic Comment A Warthin-Starry stain is performed to determine the possibility of the presence of Helicobacter pylori. The Warthin-Starry stain is negative for organisms of Helicobacter pylori. Vicente Males MD Pathologist, Electronic Signature (Case signed 10/08/2017) Specimen Gross and Clinical Information Specimen(s) Obtained: Stomach, biopsy Specimen Clinical Information Pre-op: dysphagia; Post-op: hiatal hernia, hiatus 39/37; erosions at GE junction, Schatzki's ring, small gastric ulcer Gross Received in formalin are tan, soft tissue fragments that are submitted in toto. Number: three, Size: range from 0.3 to 0.4 cm, (1 B) (KL:kh 10-04-17) Stain(s) used in Diagnosis: The following stain(s) were used in diagnosing the case: Warthin-Starry Stain. The control(s) stained appropriately. Report signed

## 2017-10-09 NOTE — Telephone Encounter (Signed)
Patient left this number also -[phone] ext [phone]

## 2017-10-10 ENCOUNTER — Encounter (HOSPITAL_COMMUNITY): Payer: Self-pay | Admitting: Internal Medicine

## 2017-10-10 ENCOUNTER — Telehealth: Payer: Self-pay | Admitting: Orthopedic Surgery

## 2017-10-10 DIAGNOSIS — M25342 Other instability, left hand: Secondary | ICD-10-CM | POA: Diagnosis not present

## 2017-10-10 DIAGNOSIS — M25442 Effusion, left hand: Secondary | ICD-10-CM | POA: Diagnosis not present

## 2017-10-10 DIAGNOSIS — M25842 Other specified joint disorders, left hand: Secondary | ICD-10-CM | POA: Diagnosis not present

## 2017-10-10 DIAGNOSIS — M20012 Mallet finger of left finger(s): Secondary | ICD-10-CM | POA: Diagnosis not present

## 2017-10-10 NOTE — Telephone Encounter (Signed)
Elizabeth Roberson called today asking if I thought she needed to make an appointment here.  She said she hurt her finger several weeks ago. Dr. Nevada Crane had her go to a PT and get a splint put on it.  She wanted to know if she should schedule an appointment here.  I told her that since I no information whatsoever on her, maybe she should call back to Dr. Juel Burrow office and have them forward her notes for Dr. Aline Brochure to review.  She said she would do this.

## 2017-10-12 NOTE — Telephone Encounter (Signed)
Called her yesterday.

## 2017-11-05 ENCOUNTER — Encounter: Payer: Self-pay | Admitting: Orthopedic Surgery

## 2017-11-05 ENCOUNTER — Ambulatory Visit (INDEPENDENT_AMBULATORY_CARE_PROVIDER_SITE_OTHER): Payer: 59 | Admitting: Orthopedic Surgery

## 2017-11-05 ENCOUNTER — Ambulatory Visit (INDEPENDENT_AMBULATORY_CARE_PROVIDER_SITE_OTHER): Payer: Medicare Other

## 2017-11-05 VITALS — BP 137/85 | HR 63 | Ht 65.0 in | Wt 194.0 lb

## 2017-11-05 DIAGNOSIS — M20019 Mallet finger of unspecified finger(s): Secondary | ICD-10-CM

## 2017-11-05 NOTE — Progress Notes (Signed)
NEW PATIENT OFFICE VISIT   Chief Complaint  Patient presents with  . Hand Injury    left 5th finger/ mallet deformity date of injury 09/28/17    67 year old female was placing a cover on her couch Plaisted in the crevice pulled her him back in she noticed deformity of the small finger DIP joint  She is seeing [facility] been in since the 21st.  She comes in with a small amount of dull aching pain over the DIP joint associated with inability to extend   Review of Systems  Skin: Negative.   Neurological: Negative for tingling.     Past Medical History:  Diagnosis Date  . Allergy   . Dysphagia 09/10/2017  . Hip pain, chronic 12/12/2015   Has bilateral hip for past 20 years when walks more than a few minutes, no loss of ROM  . Hypertension   . Vaginal Pap smear, abnormal   . Vasculitis ([place])     Past Surgical History:  Procedure Laterality Date  . BALLOON DILATION N/A 07/09/2014   Procedure: BALLOON DILATION;  Surgeon: Rogene Houston, MD;  [facility];  Service: Endoscopy;  Laterality: N/A;  . BIOPSY  10/04/2017   Procedure: BIOPSY;  Surgeon: Rogene Houston, MD;  [facility];  Service: Endoscopy;;  gastric  . COLONOSCOPY    . COLONOSCOPY N/A 07/09/2014   Procedure: COLONOSCOPY;  Surgeon: Rogene Houston, MD;  [facility];  Service: Endoscopy;  Laterality: N/A;  730  . ESOPHAGEAL DILATION N/A 10/04/2017   Procedure: ESOPHAGEAL DILATION;  Surgeon: Rogene Houston, MD;  [facility];  Service: Endoscopy;  Laterality: N/A;  . ESOPHAGOGASTRODUODENOSCOPY N/A 07/09/2014   Procedure: ESOPHAGOGASTRODUODENOSCOPY (EGD);  Surgeon: Rogene Houston, MD;  [facility];  Service: Endoscopy;  Laterality: N/A;  . ESOPHAGOGASTRODUODENOSCOPY N/A 10/04/2017   Procedure: ESOPHAGOGASTRODUODENOSCOPY (EGD);  Surgeon: Rogene Houston, MD;  [facility];  Service: Endoscopy;   Laterality: N/A;  8:55  . laser surgery on cervix    . MALONEY DILATION N/A 07/09/2014   Procedure: Venia Minks DILATION;  Surgeon: Rogene Houston, MD;  [facility];  Service: Endoscopy;  Laterality: N/A;  . TUBAL LIGATION      Family History  Problem Relation Age of Onset  . COPD Mother   . Alzheimer's disease Mother   . Hypertension Son    Social History   Tobacco Use  . Smoking status: Never Smoker  . Smokeless tobacco: Never Used  Substance Use Topics  . Alcohol use: No  . Drug use: No    @ALL @  Current Meds  Medication Sig  . acetaminophen (TYLENOL) 500 MG tablet Take 500 mg by mouth every 6 (six) hours as needed for moderate pain or headache.  . cetirizine (ZYRTEC) 10 MG tablet Take 10 mg by mouth daily.  Marland Kitchen losartan (COZAAR) 50 MG tablet Take 50 mg by mouth daily.  . meloxicam (MOBIC) 15 MG tablet Take 15 mg by mouth daily.  . montelukast (SINGULAIR) 10 MG tablet Take 10 mg by mouth daily as needed (for allergies).   . pantoprazole (PROTONIX) 40 MG tablet Take 1 tablet (40 mg total) by mouth 2 (two) times daily before a meal.    BP 137/85   Pulse 63   Ht 5\' 5"  (1.651 m)   Wt 194 lb (88 kg)   BMI 32.28 kg/m   Physical  Exam  Constitutional: She is oriented to person, place, and time. She appears well-developed and well-nourished.  Neurological: She is alert and oriented to person, place, and time.  Psychiatric: She has a normal mood and affect. Judgment normal.  Vitals reviewed.   Ortho Exam  Examination small finger left hand there is a small mallet deformity.  There is tenderness of the DIP and PIP joints.  Passive range of motion is normal both IP joints are stable flexor tendon strength is normal no extension power the DIP joint skin is a little erythematous from irritation of the splint capillary refill normal epitrochlear lymph nodes negative sensation of the digit normal/in comparison the right small finger/normal range of motion stability  strength no deformity normal neurovascular exam skin intact MEDICAL DECISION SECTION  xrays ordered?  Yes  My independent reading of xrays: Multiple views left small finger no fracture dislocation is seen   Encounter Diagnosis  Name Primary?  . Mallet deformity of little finger Yes     PLAN:   No orders of the defined types were placed in this encounter.  Injection? NO MRI/CT/? NO

## 2017-11-05 NOTE — Patient Instructions (Signed)
Mallet Finger  Mallet finger is an injury that occurs from a blow to the tip of your straightened finger or thumb. It is also known as baseball finger. The blow to your fingertip causes it to bend farther than normal, which tears the cord that attaches to the tip of your finger (extensor tendon). Your extensor tendon is what straightens the end of your finger. If this tendon is damaged, you will not be able to straighten your fingertip. Sometimes, a piece of bone may be pulled away with the tendon (avulsion injury), or the tendon may tear completely. In some cases, surgery may be required to repair the damage.  What are the causes?  Mallet finger is caused by a hard, direct hit to the tip of your finger or thumb. This injury often happens from getting hit in the finger with a hard ball, such as a baseball.  What increases the risk?  This injury is more likely to happen if you play sports that use a hard ball.  What are the signs or symptoms?  The main symptom of this injury is not being able to straighten the tip of your finger. You can manually straighten your fingertip with your other hand, but the finger cannot straighten on its own. Other symptoms may include:   Pain.   Swelling.   Bruising.   Blood under the fingernail.    How is this diagnosed?  Your health care provider may suspect mallet finger if you are not able to extend your fingertip, especially if you recently injured your hand. Your health care provider will do a physical exam. This may include X-rays to see if a piece of bone has been pulled away or if the finger joint has separated (dislocated).  How is this treated?  Mallet finger may be treated with:   Wearing a splint on your fingertip to keep it straight (extended) while the tendon heals.   Surgery to repair the tendon, in severe cases. This may involve:  ? The use of a pin or screw to keep your finger extended and your tendon attached.  ? Taking a piece of tendon from another part of your  body (graft) to replace a torn tendon.    Follow these instructions at home:   Take medicines only as directed by your health care provider.   Wear the splint as directed by your health care provider. Remove it only as directed by your health care provider.   If you take your splint off to dry it or change it, gently press your finger on a flat surface to keep it straight.   If directed, apply ice to the injured area:  ? Put ice in a plastic bag.  ? Place a towel between your skin and the bag.  ? Leave the ice on for 20 minutes, 2-3 times a day.   Raise the injured area above the level of your heart while you are sitting or lying down.  Contact a health care provider if:   You have pain or swelling that is getting worse.   Your finger feels cold.   You cannot extend your finger after treatment.  Get help right away if:   Even after loosening your splint, your finger is:  ? Very red and swollen.  ? White or blue.  ? Numb or tingling.  This information is not intended to replace advice given to you by your health care provider. Make sure you discuss any questions you have with 

## 2017-11-06 MED FILL — PANTOPRAZOLE SOD DR 40 MG T: 40 | 90 days supply | Qty: 180 | Fill #0

## 2017-11-06 MED FILL — SHIPPING COST: 1 days supply | Qty: 1 | Fill #3

## 2017-11-06 MED FILL — LOSARTAN POTASSIUM 50 MG TA: 50 | 90 days supply | Qty: 90 | Fill #1

## 2017-11-07 ENCOUNTER — Telehealth: Payer: Self-pay | Admitting: Orthopedic Surgery

## 2017-11-07 ENCOUNTER — Other Ambulatory Visit: Payer: Self-pay | Admitting: *Deleted

## 2017-11-07 DIAGNOSIS — M20019 Mallet finger of unspecified finger(s): Secondary | ICD-10-CM

## 2017-11-07 NOTE — Telephone Encounter (Signed)
Haley from [facility] called asking to get a order for this patient's Hand Therapy and Splint. She asked also for office note.  Can fax to [phone] Attn: Hildred Alamin

## 2017-11-07 NOTE — Telephone Encounter (Signed)
Have refaxed  

## 2017-11-11 DIAGNOSIS — M25842 Other specified joint disorders, left hand: Secondary | ICD-10-CM | POA: Diagnosis not present

## 2017-11-11 DIAGNOSIS — M25442 Effusion, left hand: Secondary | ICD-10-CM | POA: Diagnosis not present

## 2017-11-11 DIAGNOSIS — M25342 Other instability, left hand: Secondary | ICD-10-CM | POA: Diagnosis not present

## 2017-11-11 DIAGNOSIS — M20012 Mallet finger of left finger(s): Secondary | ICD-10-CM | POA: Diagnosis not present

## 2017-11-20 DIAGNOSIS — R05 Cough: Secondary | ICD-10-CM | POA: Diagnosis not present

## 2017-11-20 DIAGNOSIS — H04209 Unspecified epiphora, unspecified lacrimal gland: Secondary | ICD-10-CM | POA: Diagnosis not present

## 2017-11-20 DIAGNOSIS — J060 Acute laryngopharyngitis: Secondary | ICD-10-CM | POA: Diagnosis not present

## 2017-11-20 DIAGNOSIS — M20012 Mallet finger of left finger(s): Secondary | ICD-10-CM | POA: Diagnosis not present

## 2017-11-20 DIAGNOSIS — Z0000 Encounter for general adult medical examination without abnormal findings: Secondary | ICD-10-CM | POA: Diagnosis not present

## 2017-11-20 DIAGNOSIS — Z6832 Body mass index (BMI) 32.0-32.9, adult: Secondary | ICD-10-CM | POA: Diagnosis not present

## 2017-11-20 DIAGNOSIS — K222 Esophageal obstruction: Secondary | ICD-10-CM | POA: Diagnosis not present

## 2017-11-20 DIAGNOSIS — R1310 Dysphagia, unspecified: Secondary | ICD-10-CM | POA: Diagnosis not present

## 2017-11-20 DIAGNOSIS — K219 Gastro-esophageal reflux disease without esophagitis: Secondary | ICD-10-CM | POA: Diagnosis not present

## 2017-11-25 DIAGNOSIS — Z0000 Encounter for general adult medical examination without abnormal findings: Secondary | ICD-10-CM | POA: Diagnosis not present

## 2017-11-25 DIAGNOSIS — M2550 Pain in unspecified joint: Secondary | ICD-10-CM | POA: Diagnosis not present

## 2017-11-25 DIAGNOSIS — I10 Essential (primary) hypertension: Secondary | ICD-10-CM | POA: Diagnosis not present

## 2017-11-25 DIAGNOSIS — K222 Esophageal obstruction: Secondary | ICD-10-CM | POA: Diagnosis not present

## 2017-11-25 DIAGNOSIS — K279 Peptic ulcer, site unspecified, unspecified as acute or chronic, without hemorrhage or perforation: Secondary | ICD-10-CM | POA: Diagnosis not present

## 2017-11-25 DIAGNOSIS — M1990 Unspecified osteoarthritis, unspecified site: Secondary | ICD-10-CM | POA: Diagnosis not present

## 2017-11-25 DIAGNOSIS — M5440 Lumbago with sciatica, unspecified side: Secondary | ICD-10-CM | POA: Diagnosis not present

## 2017-11-25 DIAGNOSIS — J309 Allergic rhinitis, unspecified: Secondary | ICD-10-CM | POA: Diagnosis not present

## 2017-11-25 DIAGNOSIS — Z6832 Body mass index (BMI) 32.0-32.9, adult: Secondary | ICD-10-CM | POA: Diagnosis not present

## 2017-11-27 DIAGNOSIS — M25342 Other instability, left hand: Secondary | ICD-10-CM | POA: Diagnosis not present

## 2017-11-27 DIAGNOSIS — M20012 Mallet finger of left finger(s): Secondary | ICD-10-CM | POA: Diagnosis not present

## 2017-11-27 DIAGNOSIS — M25442 Effusion, left hand: Secondary | ICD-10-CM | POA: Diagnosis not present

## 2017-11-27 DIAGNOSIS — M25842 Other specified joint disorders, left hand: Secondary | ICD-10-CM | POA: Diagnosis not present

## 2017-12-02 ENCOUNTER — Encounter: Payer: Self-pay | Admitting: Orthopedic Surgery

## 2017-12-02 ENCOUNTER — Ambulatory Visit (INDEPENDENT_AMBULATORY_CARE_PROVIDER_SITE_OTHER): Payer: 59 | Admitting: Orthopedic Surgery

## 2017-12-02 VITALS — BP 130/83 | HR 69 | Ht 65.0 in | Wt 194.0 lb

## 2017-12-02 DIAGNOSIS — M20019 Mallet finger of unspecified finger(s): Secondary | ICD-10-CM

## 2017-12-02 NOTE — Progress Notes (Signed)
Progress Note   Patient ID: Elizabeth Roberson, female   DOB: [birth year], 67 y.o.   MRN: [id number]  Chief Complaint  Patient presents with  . Hand Problem    5th finger mallet deformity      Medical decision-making Encounter Diagnosis  Name Primary?  . Mallet deformity of little finger Yes   Mallet deformity date of injury March 9   Patient's mallet deformity is 20 degrees passively correctable active extension -20 continue splinting come back in 6 weeks   Chief Complaint  Patient presents with  . Hand Problem    5th finger mallet deformity     67 year old female was placing a cover on her couch Plaisted in the crevice pulled her him back in she noticed deformity of the small finger DIP joint  She is seeing her [facility] she has been in since the 21st.  She comes in with a small amount of dull aching pain over the DIP joint associated with inability to extend  Treated with occupational therapy comes in for follow-up she says she is doing well but frustrated      Arther Abbott, MD 12/02/2017 4:31 PM

## 2017-12-11 DIAGNOSIS — M25842 Other specified joint disorders, left hand: Secondary | ICD-10-CM | POA: Diagnosis not present

## 2017-12-11 DIAGNOSIS — M20012 Mallet finger of left finger(s): Secondary | ICD-10-CM | POA: Diagnosis not present

## 2017-12-11 DIAGNOSIS — M25442 Effusion, left hand: Secondary | ICD-10-CM | POA: Diagnosis not present

## 2017-12-11 DIAGNOSIS — M25342 Other instability, left hand: Secondary | ICD-10-CM | POA: Diagnosis not present

## 2017-12-26 DIAGNOSIS — M25442 Effusion, left hand: Secondary | ICD-10-CM | POA: Diagnosis not present

## 2017-12-26 DIAGNOSIS — M25842 Other specified joint disorders, left hand: Secondary | ICD-10-CM | POA: Diagnosis not present

## 2017-12-26 DIAGNOSIS — M25342 Other instability, left hand: Secondary | ICD-10-CM | POA: Diagnosis not present

## 2017-12-26 DIAGNOSIS — M20012 Mallet finger of left finger(s): Secondary | ICD-10-CM | POA: Diagnosis not present

## 2018-01-02 DIAGNOSIS — M25342 Other instability, left hand: Secondary | ICD-10-CM | POA: Diagnosis not present

## 2018-01-02 DIAGNOSIS — M25442 Effusion, left hand: Secondary | ICD-10-CM | POA: Diagnosis not present

## 2018-01-02 DIAGNOSIS — M25842 Other specified joint disorders, left hand: Secondary | ICD-10-CM | POA: Diagnosis not present

## 2018-01-02 DIAGNOSIS — M20012 Mallet finger of left finger(s): Secondary | ICD-10-CM | POA: Diagnosis not present

## 2018-01-09 DIAGNOSIS — M25842 Other specified joint disorders, left hand: Secondary | ICD-10-CM | POA: Diagnosis not present

## 2018-01-09 DIAGNOSIS — M20012 Mallet finger of left finger(s): Secondary | ICD-10-CM | POA: Diagnosis not present

## 2018-01-09 DIAGNOSIS — M25442 Effusion, left hand: Secondary | ICD-10-CM | POA: Diagnosis not present

## 2018-01-09 DIAGNOSIS — M25342 Other instability, left hand: Secondary | ICD-10-CM | POA: Diagnosis not present

## 2018-01-13 ENCOUNTER — Ambulatory Visit (INDEPENDENT_AMBULATORY_CARE_PROVIDER_SITE_OTHER): Payer: 59 | Admitting: Orthopedic Surgery

## 2018-01-13 ENCOUNTER — Encounter: Payer: Self-pay | Admitting: Orthopedic Surgery

## 2018-01-13 VITALS — BP 135/82 | HR 76 | Ht 65.0 in | Wt 195.0 lb

## 2018-01-13 DIAGNOSIS — M20019 Mallet finger of unspecified finger(s): Secondary | ICD-10-CM | POA: Diagnosis not present

## 2018-01-13 NOTE — Progress Notes (Signed)
Routine follow-up visit  Mallet finger  Chief Complaint  Patient presents with  . Finger Injury    09/28/17 mallet finger left 5th finger     67 year old female treated 13 weeks of splinting for mallet finger presents back for recheck  She is happy with the finger as it is or excepting at least.  Exam shows a 10 degree flexion deformity lack of extension no tenderness no skin changes  Impression mallet finger left small finger   plan release  Encounter Diagnosis  Name Primary?  . Mallet deformity of little finger Yes

## 2018-01-14 ENCOUNTER — Encounter (INDEPENDENT_AMBULATORY_CARE_PROVIDER_SITE_OTHER): Payer: Self-pay | Admitting: Orthopaedic Surgery

## 2018-01-14 ENCOUNTER — Ambulatory Visit (INDEPENDENT_AMBULATORY_CARE_PROVIDER_SITE_OTHER): Payer: Medicare Other

## 2018-01-14 ENCOUNTER — Ambulatory Visit (INDEPENDENT_AMBULATORY_CARE_PROVIDER_SITE_OTHER): Payer: Self-pay

## 2018-01-14 ENCOUNTER — Ambulatory Visit (INDEPENDENT_AMBULATORY_CARE_PROVIDER_SITE_OTHER): Payer: 59 | Admitting: Orthopaedic Surgery

## 2018-01-14 DIAGNOSIS — M25551 Pain in right hip: Secondary | ICD-10-CM | POA: Diagnosis not present

## 2018-01-14 DIAGNOSIS — M25562 Pain in left knee: Secondary | ICD-10-CM

## 2018-01-14 DIAGNOSIS — M25552 Pain in left hip: Secondary | ICD-10-CM

## 2018-01-14 DIAGNOSIS — G8929 Other chronic pain: Secondary | ICD-10-CM | POA: Diagnosis not present

## 2018-01-14 NOTE — Progress Notes (Signed)
Office Visit Note   Patient: Elizabeth Roberson           Date of Birth: October 25, 1950           MRN: [id number] Visit Date: 01/14/2018              Requested by: Celene Squibb, MD [place], [place] [id number] PCP: Celene Squibb, MD   Assessment & Plan: Visit Diagnoses:  1. Chronic pain of left knee   2. Pain of both hip joints     Plan: Impression is bilateral hip mild osteoarthritis and left knee moderate degenerative joint disease versus degenerative medial meniscal tear.  Patient declined a cortisone injection for her left knee today she would like to continue to try taking meloxicam to see if this will improve the pain.  We mentioned possibility of obtaining an MRI to rule out meniscal pathology versus insufficiency fracture.  She does have significant joint space narrowing of the medial compartment.  She will let us know if she fails to improve.  Follow-Up Instructions: Return if symptoms worsen or fail to improve.   Orders:  Orders Placed This Encounter  Procedures  . XR KNEE 3 VIEW LEFT  . XR HIPS BILAT W OR W/O PELVIS 3-4 VIEWS   No orders of the defined types were placed in this encounter.     Procedures: No procedures performed   Clinical Data: No additional findings.   Subjective: Chief Complaint  Patient presents with  . Left Knee - Pain    Patient is a 67 year old female who comes in with bilateral hip pain and left knee pain that is characterized by start up stiffness and pain.  She does endorse some swelling.  Occasionally she feels like the knee wants to pop.  She denies any back pain.  Her knee pain is mainly medial.  Denies any mechanical symptoms.  Her hips hurt after prolonged sitting or walking.  Denies any numbness and tingling.   Review of Systems  Constitutional: Negative.   HENT: Negative.   Eyes: Negative.   Respiratory: Negative.   Cardiovascular: Negative.   Endocrine: Negative.   Musculoskeletal: Negative.     Neurological: Negative.   Hematological: Negative.   Psychiatric/Behavioral: Negative.   All other systems reviewed and are negative.    Objective: Vital Signs: There were no vitals taken for this visit.  Physical Exam  Constitutional: She is oriented to person, place, and time. She appears well-developed and well-nourished.  HENT:  Head: Normocephalic and atraumatic.  Eyes: EOM are normal.  Neck: Neck supple.  Pulmonary/Chest: Effort normal.  Abdominal: Soft.  Neurological: She is alert and oriented to person, place, and time.  Skin: Skin is warm. Capillary refill takes less than 2 seconds.  Psychiatric: She has a normal mood and affect. Her behavior is normal. Judgment and thought content normal.  Nursing note and vitals reviewed.   Ortho Exam Left knee exam shows no significant joint effusion.  Collaterals and cruciates are stable.  Medial joint line tenderness.  1+ patellofemoral crepitus.  Bilateral hip exam shows no tenderness over the trochanteric bursa.  Painless range of motion.  Negative straight leg. Specialty Comments:  No specialty comments available.  Imaging: Xr Hips Bilat W Or W/o Pelvis 3-4 Views  Result Date: 01/14/2018 No acute or structural abnormalities.  No significant joint space narrowing or evidence of degenerative joint disease.  Xr Knee 3 View Left  Result Date: 01/14/2018 Mild to moderate tricompartmental  osteoarthritis    PMFS History: Patient Active Problem List   Diagnosis Date Noted  . Esophageal dysphagia 09/17/2017  . Dysphagia 09/10/2017  . Hip pain, chronic 12/12/2015  . Hypertension 03/18/2013  . Multiple allergies 03/18/2013   Past Medical History:  Diagnosis Date  . Allergy   . Dysphagia 09/10/2017  . Hip pain, chronic 12/12/2015   Has bilateral hip for past 20 years when walks more than a few minutes, no loss of ROM  . Hypertension   . Vaginal Pap smear, abnormal   . Vasculitis ([place])     Family History  Problem  Relation Age of Onset  . COPD Mother   . Alzheimer's disease Mother   . Hypertension Son     Past Surgical History:  Procedure Laterality Date  . BALLOON DILATION N/A 07/09/2014   Procedure: BALLOON DILATION;  Surgeon: Rogene Houston, MD;  [facility];  Service: Endoscopy;  Laterality: N/A;  . BIOPSY  10/04/2017   Procedure: BIOPSY;  Surgeon: Rogene Houston, MD;  [facility];  Service: Endoscopy;;  gastric  . COLONOSCOPY    . COLONOSCOPY N/A 07/09/2014   Procedure: COLONOSCOPY;  Surgeon: Rogene Houston, MD;  [facility];  Service: Endoscopy;  Laterality: N/A;  730  . ESOPHAGEAL DILATION N/A 10/04/2017   Procedure: ESOPHAGEAL DILATION;  Surgeon: Rogene Houston, MD;  [facility];  Service: Endoscopy;  Laterality: N/A;  . ESOPHAGOGASTRODUODENOSCOPY N/A 07/09/2014   Procedure: ESOPHAGOGASTRODUODENOSCOPY (EGD);  Surgeon: Rogene Houston, MD;  [facility];  Service: Endoscopy;  Laterality: N/A;  . ESOPHAGOGASTRODUODENOSCOPY N/A 10/04/2017   Procedure: ESOPHAGOGASTRODUODENOSCOPY (EGD);  Surgeon: Rogene Houston, MD;  [facility];  Service: Endoscopy;  Laterality: N/A;  8:55  . laser surgery on cervix    . MALONEY DILATION N/A 07/09/2014   Procedure: Venia Minks DILATION;  Surgeon: Rogene Houston, MD;  [facility];  Service: Endoscopy;  Laterality: N/A;  . TUBAL LIGATION     Social History   Occupational History  . Not on file  Tobacco Use  . Smoking status: Never Smoker  . Smokeless tobacco: Never Used  Substance and Sexual Activity  . Alcohol use: No  . Drug use: No  . Sexual activity: Yes    Birth control/protection: Post-menopausal, Surgical    Comment: tubal

## 2018-03-06 MED FILL — SHIPPING COST: 1 days supply | Qty: 1 | Fill #4

## 2018-03-06 MED FILL — LOSARTAN POTASSIUM 50 MG TA: 50 | 90 days supply | Qty: 90 | Fill #0

## 2018-04-30 MED FILL — SHIPPING COST: 1 days supply | Qty: 1 | Fill #5

## 2018-04-30 MED FILL — PANTOPRAZOLE SOD DR 40 MG T: 40 | 90 days supply | Qty: 180 | Fill #1

## 2018-07-02 MED FILL — SHIPPING COST: 1 days supply | Qty: 1 | Fill #6

## 2018-07-02 MED FILL — LOSARTAN POTASSIUM 50 MG TA: 50 | 30 days supply | Qty: 30 | Fill #1

## 2018-11-24 DIAGNOSIS — I10 Essential (primary) hypertension: Secondary | ICD-10-CM | POA: Diagnosis not present

## 2018-11-27 DIAGNOSIS — J309 Allergic rhinitis, unspecified: Secondary | ICD-10-CM | POA: Diagnosis not present

## 2018-11-27 DIAGNOSIS — S51852A Open bite of left forearm, initial encounter: Secondary | ICD-10-CM | POA: Diagnosis not present

## 2018-11-27 DIAGNOSIS — Z0000 Encounter for general adult medical examination without abnormal findings: Secondary | ICD-10-CM | POA: Diagnosis not present

## 2018-11-27 DIAGNOSIS — I10 Essential (primary) hypertension: Secondary | ICD-10-CM | POA: Diagnosis not present

## 2018-11-27 DIAGNOSIS — K219 Gastro-esophageal reflux disease without esophagitis: Secondary | ICD-10-CM | POA: Diagnosis not present

## 2018-11-27 DIAGNOSIS — S61559A Open bite of unspecified wrist, initial encounter: Secondary | ICD-10-CM | POA: Diagnosis not present

## 2018-11-27 DIAGNOSIS — M1991 Primary osteoarthritis, unspecified site: Secondary | ICD-10-CM | POA: Diagnosis not present

## 2019-01-19 DIAGNOSIS — M79605 Pain in left leg: Secondary | ICD-10-CM | POA: Diagnosis not present

## 2019-01-19 DIAGNOSIS — M1991 Primary osteoarthritis, unspecified site: Secondary | ICD-10-CM | POA: Diagnosis not present

## 2019-02-13 DIAGNOSIS — H5203 Hypermetropia, bilateral: Secondary | ICD-10-CM | POA: Diagnosis not present

## 2019-02-13 DIAGNOSIS — H2513 Age-related nuclear cataract, bilateral: Secondary | ICD-10-CM | POA: Diagnosis not present

## 2019-02-13 DIAGNOSIS — H524 Presbyopia: Secondary | ICD-10-CM | POA: Diagnosis not present

## 2019-03-12 IMAGING — MR MR 3D RECON AT SCANNER
22 series · 22 of 22 positions shown · IV contrast (multihance)
Comparison: MRI of the abdomen 05/03/2016.

CLINICAL DATA: 66-year-old female with history of cystic lesion in
the pancreatic head on prior examinations. Followup study.

EXAM:
MRI ABDOMEN WITHOUT AND WITH CONTRAST
TECHNIQUE: Multiplanar multisequence MR imaging of the abdomen was performed
both before and after the administration of intravenous contrast.
CONTRAST:  18mL MULTIHANCE GADOBENATE DIMEGLUMINE 529 MG/ML IV SOLN

[Series 3: T2 · coronal · 5.0mm · 1.05mm/px · 1 of 36 slices shown (1 of 3)]
[im 1/36]
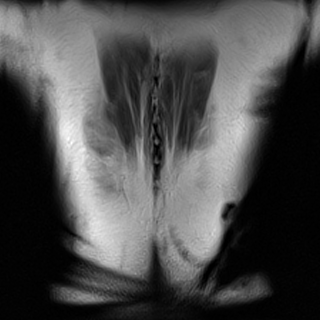

[Series 4: t2fs axial blade · axial · 4.0mm · 1.16mm/px · 1 of 48 slices shown]
[im 1/48]
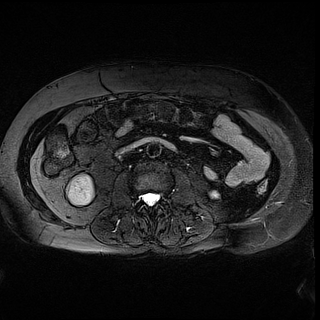

[Series 5: MRCP · coronal · 4.0mm · 0.78mm/px · 1 of 15 slices shown]
[im 1/15]
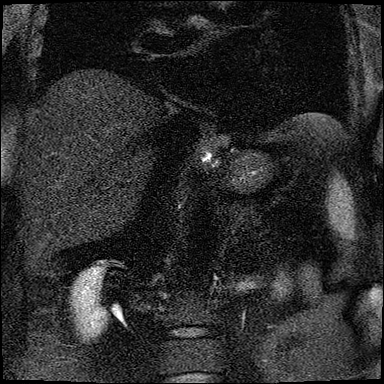

[Series 7: T2 · coronal · 1.0mm · 0.27mm/px · 1 of 104 slices shown (2 of 3)]
[im 1/104]
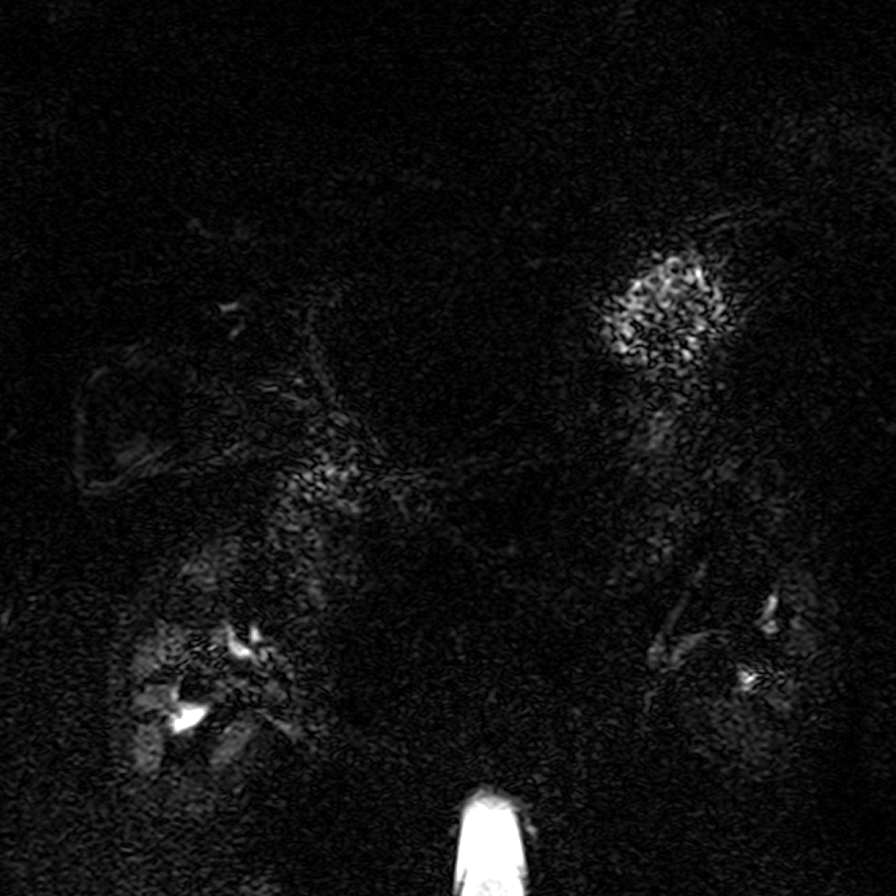

[Series 10: DWI · axial · 5.0mm · 0.99mm/px · 1 of 80 slices shown (1 of 2)]
[im 1/80]
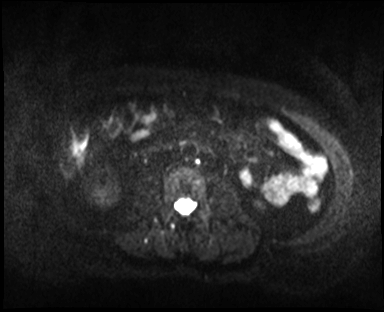

[Series 11: DWI · axial · 5.0mm · 0.95mm/px · 1 of 40 slices shown (2 of 2)]
[im 1/40]
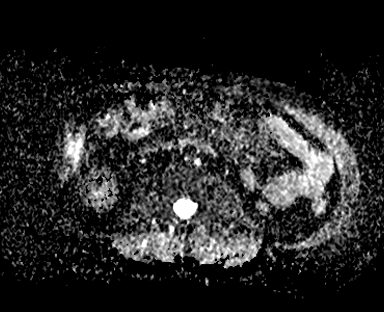

[Series 12: bSSFP · axial · 4.0mm · 1.37mm/px · 1 of 70 slices shown]
[im 1/70]
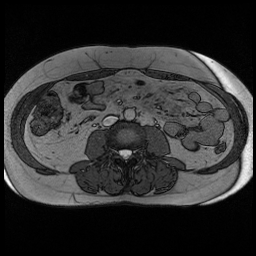

[Series 13: T1 · axial · 4.0mm · 0.59mm/px · 1 of 56 slices shown (1 of 3)]
[im 1/56]
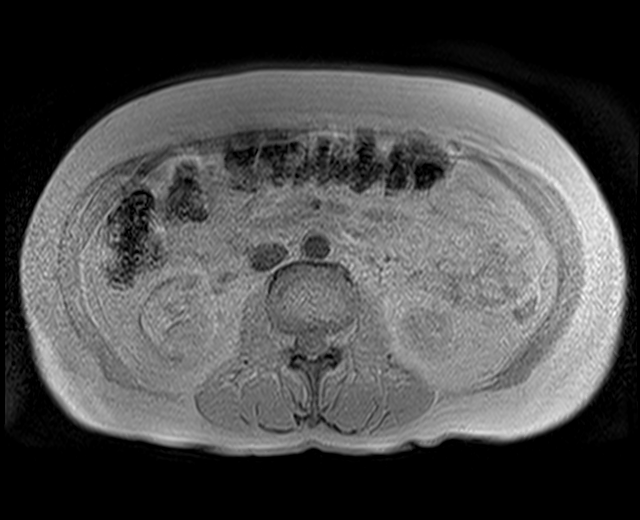

[Series 14: T1 · axial · 4.0mm · 0.59mm/px · 1 of 56 slices shown (2 of 3)]
[im 1/56]
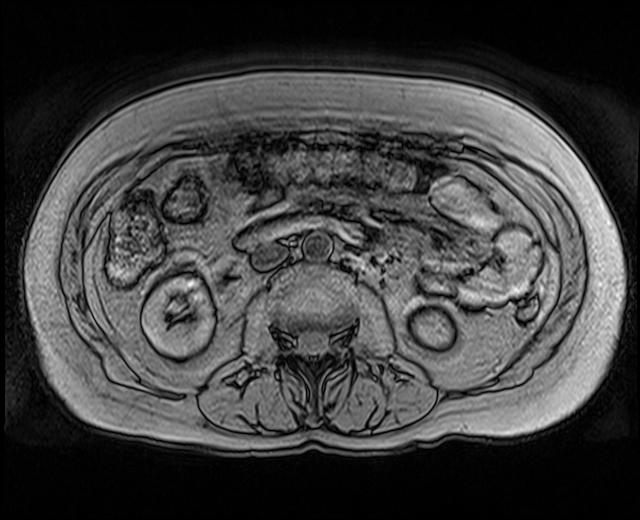

[Series 16: T1 · axial · 4.0mm · 0.59mm/px · 1 of 56 slices shown (3 of 3)]
[im 1/56]
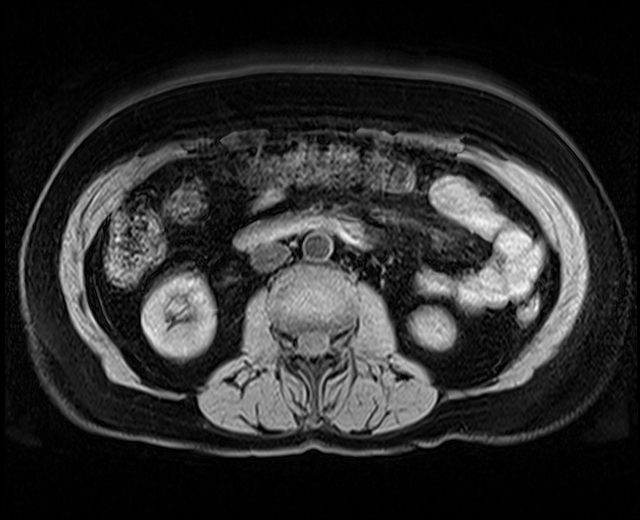

[Series 17: pre test · axial · non-contrast · 3.5mm · 1.19mm/px · 1 of 60 slices shown]
[im 1/60]
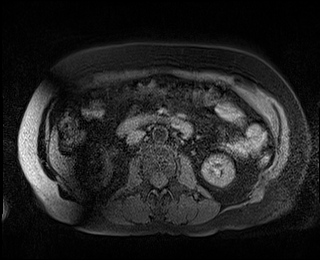

[Series 25: t1fs coronal post · coronal · 2.5mm · 1.16mm/px · 1 of 96 slices shown]
[im 1/96]
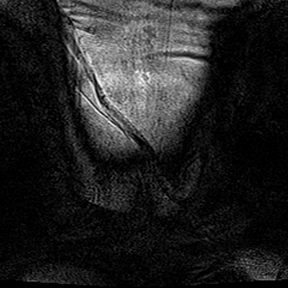

[Series 26: T2 · axial · 5.0mm · 1.48mm/px · 1 of 37 slices shown (3 of 3)]
[im 1/37]
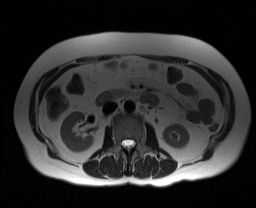

[Series 27: 5 min delay · axial · delayed · 3.5mm · 1.19mm/px · 1 of 60 slices shown]
[im 1/60]
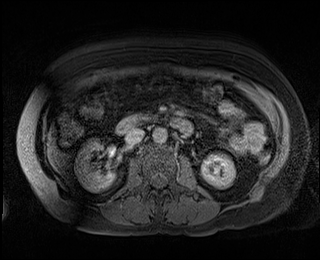

[Series 101: <mip range>_fil_1 · sagittal · 1.0mm · 0.19mm/px · 1 of 30 slices shown]
[im 1/30]
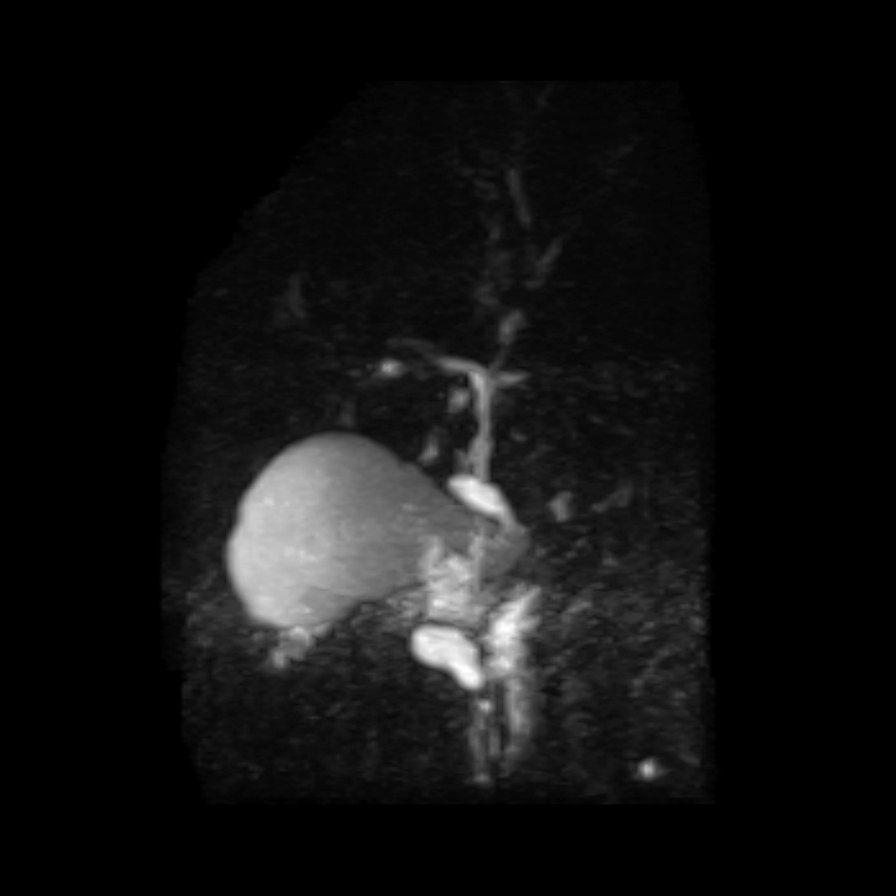

[Series 5002: 23 sec sub · axial · 3.5mm · 1.19mm/px · 1 of 60 slices shown]
[im 1/60]
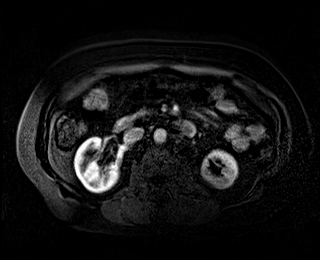

[Series 5003: 45 sec sub · axial · 3.5mm · 1.19mm/px · 1 of 60 slices shown]
[im 1/60]
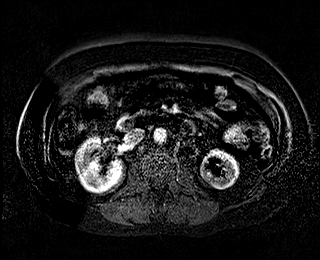

[Series 5004: 90 sec sub · axial · 3.5mm · 1.19mm/px · 1 of 60 slices shown]
[im 1/60]
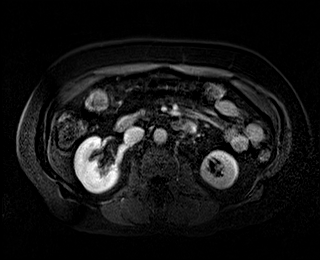

[Series 5006: 23 sec · axial · 3.5mm · 1.19mm/px · 1 of 60 slices shown]
[im 1/60]
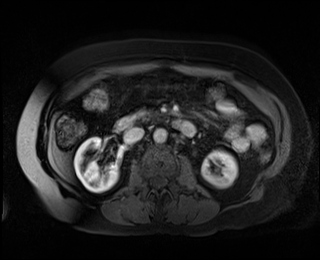

[Series 5007: 45 sec · axial · 3.5mm · 1.19mm/px · 1 of 60 slices shown]
[im 1/60]
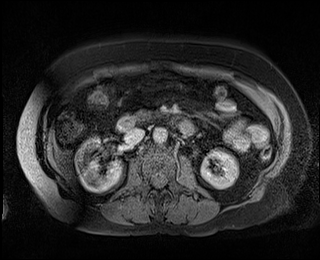

[Series 5008: 90 sec · axial · 3.5mm · 1.19mm/px · 1 of 60 slices shown]
[im 1/60]
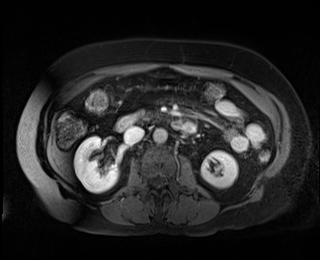

[Series 5009: 5min sub · axial · 3.5mm · 1.19mm/px · 1 of 60 slices shown]
[im 1/60]
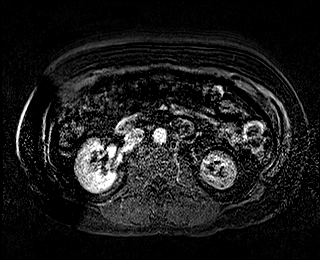

[22 of 22 positions shown; findings below may reference images not displayed]

FINDINGS: Lower chest: Unremarkable.

Hepatobiliary: No suspicious cystic or solid hepatic lesions. No
intra or extrahepatic biliary ductal dilatation noted on MRCP
images. Gallbladder is normal in appearance.

Pancreas: The again noted is a small 1.5 x 0.7 cm T1 hypointense, T2
hyperintense, nonenhancing lesion in the head of the pancreas (axial
image 32 of series 4). This is not associated with the main
pancreatic duct. No enhancing mural nodularity. No other pancreatic
mass. No pancreatic ductal dilatation noted on MRCP images. No
peripancreatic fluid or inflammatory changes.

Spleen:  Unremarkable.

Adrenals/Urinary Tract: Bilateral kidneys and adrenal glands are
normal in appearance. No hydroureteronephrosis in the visualized
portions of the abdomen.

Stomach/Bowel: Visualized portions are unremarkable in appearance.

Vascular/Lymphatic: No aneurysm identified in the visualized
abdominal vasculature. No lymphadenopathy noted in the abdomen.

Other: No significant volume of ascites noted in the visualized
portions of the peritoneal cavity.

Musculoskeletal: No aggressive appearing osseous lesions are noted
in the visualized portions of the skeleton.
IMPRESSION: 1. 1.5 x 0.7 cm simple appearing cystic lesion in the head of the
pancreas is stable compared to the prior examination, and strongly
favored to be benign. Repeat abdominal MRI with and without IV
gadolinium with MRCP is recommended in 2 years to ensure continued
stability.

## 2019-03-19 IMAGING — RF DG ESOPHAGUS
2 series · 8 of 8 positions shown · non-contrast
Comparison: None.

CLINICAL DATA: Esophageal dysphagia.

EXAM:
ESOPHOGRAM / BARIUM SWALLOW / BARIUM TABLET STUDY
TECHNIQUE: Combined double contrast and single contrast examination performed
using effervescent crystals, thick barium liquid, and thin barium
liquid. The patient was observed with fluoroscopy swallowing a 13 mm
barium sulphate tablet.
FLUOROSCOPY TIME:  Radiation Exposure Index (if provided by the
fluoroscopic device): 8.8 mGy.

[Series 1: cp_standard · 0.25mm/px · 4 of 228 frames shown (1 of 2)]
[frame 35/228]
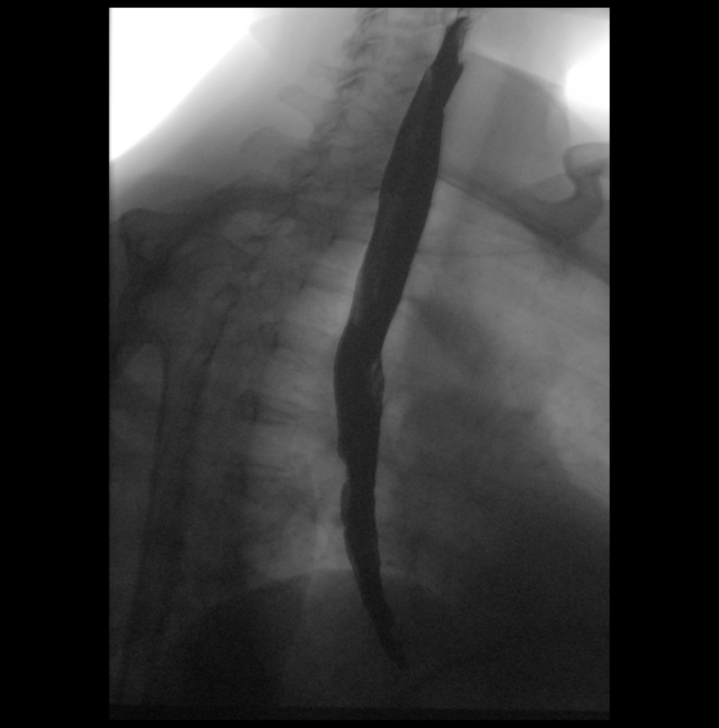
[frame 115/228]
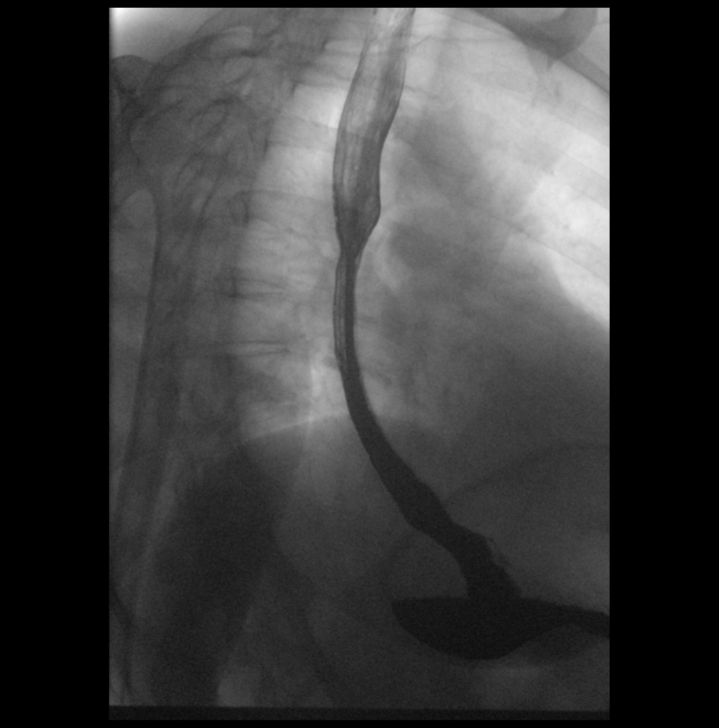
[frame 119/228]
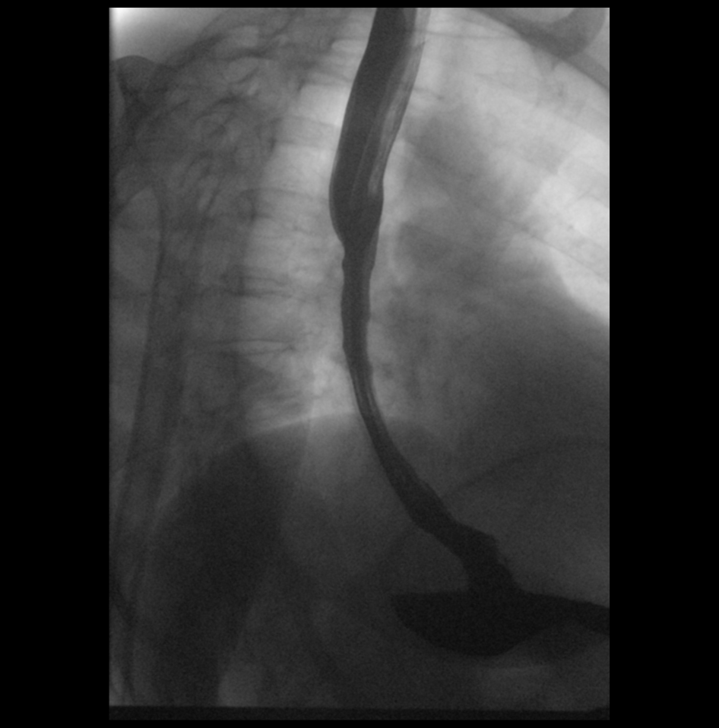
[frame 194/228]
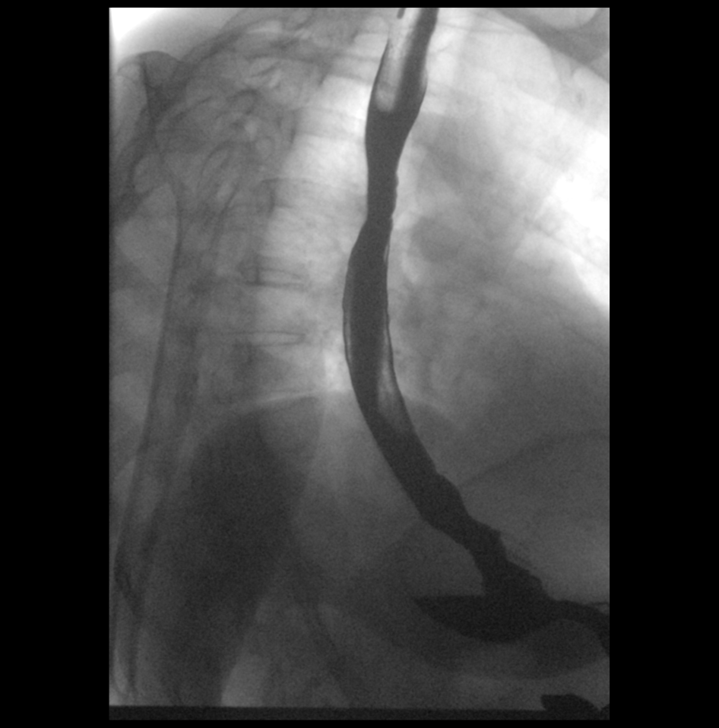

[Series 2: cp_standard · 0.28mm/px · 4 of 159 frames shown (2 of 2)]
[frame 24/159]
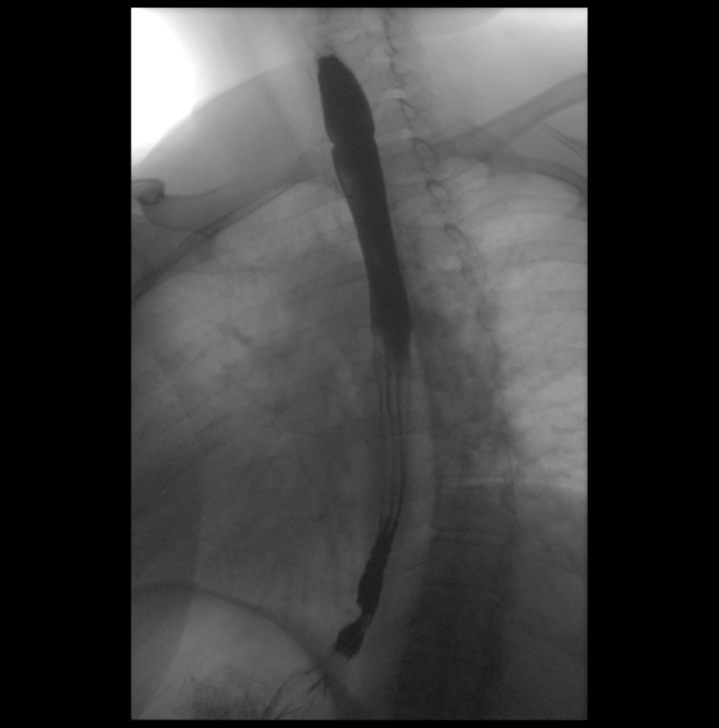
[frame 80/159]
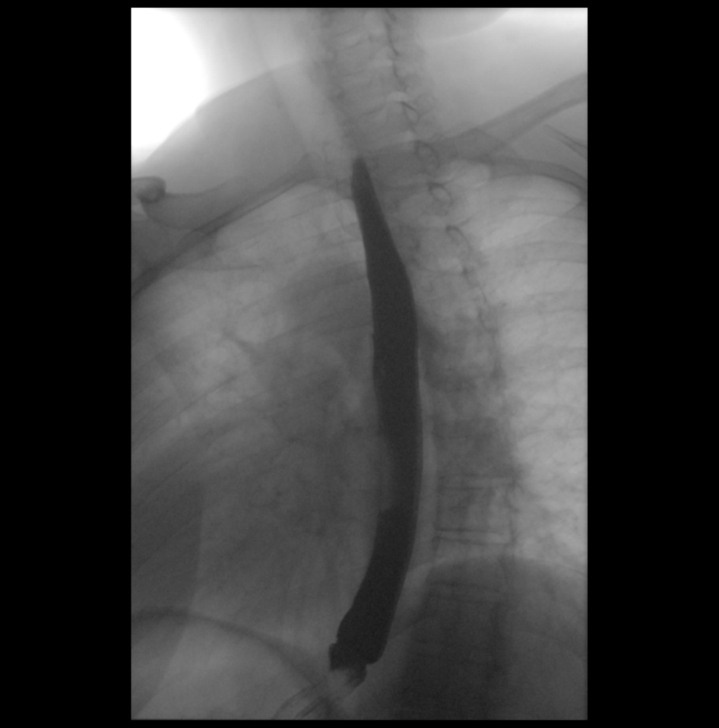
[frame 134/159]
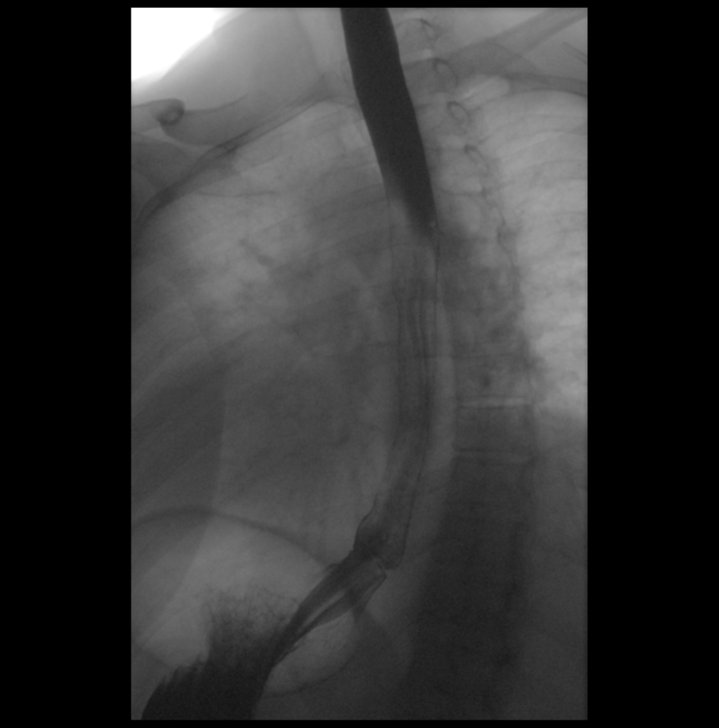
[frame 136/159]
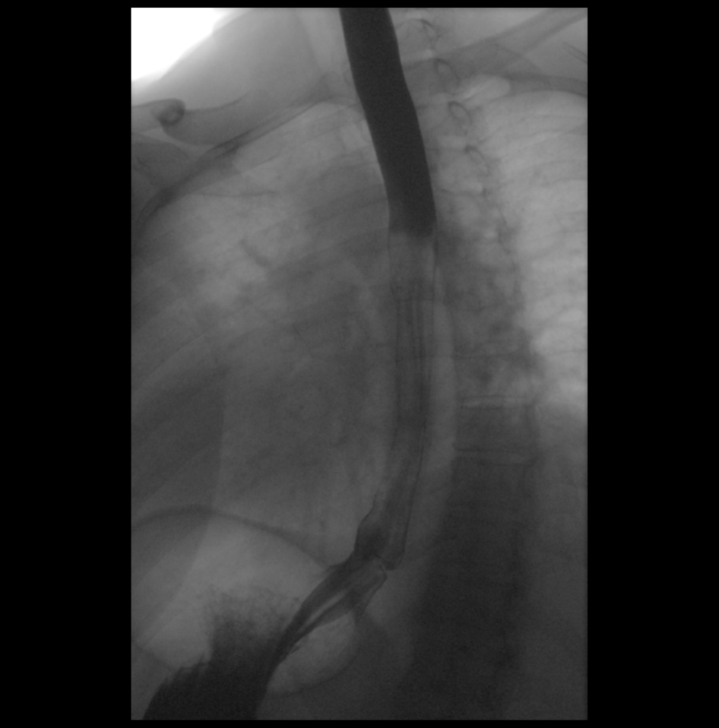

[8 of 8 positions shown; findings below may reference images not displayed]

FINDINGS: Possible small esophageal web may be present in the distal cervical
esophagus, but it does not appear to result in significant
obstruction. No other evidence of mass or stricture is noted. Small
sliding-type hiatal hernia is noted. No reflux is noted. Barium
tablet passed through esophagus and into stomach without difficulty
or delay.
IMPRESSION: Possible small esophageal web seen in distal cervical esophagus, but
does not appear to result in significant obstruction. Small
sliding-type hiatal hernia. No definite evidence of mass or
significant stricture is noted.

## 2019-04-24 ENCOUNTER — Encounter: Payer: Self-pay | Admitting: Orthopaedic Surgery

## 2019-04-24 ENCOUNTER — Ambulatory Visit (INDEPENDENT_AMBULATORY_CARE_PROVIDER_SITE_OTHER): Payer: Medicare Other | Admitting: Orthopaedic Surgery

## 2019-04-24 ENCOUNTER — Ambulatory Visit (INDEPENDENT_AMBULATORY_CARE_PROVIDER_SITE_OTHER): Payer: Medicare Other

## 2019-04-24 DIAGNOSIS — G8929 Other chronic pain: Secondary | ICD-10-CM

## 2019-04-24 DIAGNOSIS — M25562 Pain in left knee: Secondary | ICD-10-CM

## 2019-04-24 MED ORDER — BUPIVACAINE HCL 0.25 % IJ SOLN
2.0000 mL | INTRAMUSCULAR | Status: AC | PRN
Start: 2019-04-24 — End: 2019-04-24
  Administered 2019-04-24: 2 mL via INTRA_ARTICULAR

## 2019-04-24 MED ORDER — METHYLPREDNISOLONE ACETATE 40 MG/ML IJ SUSP
40.0000 mg | INTRAMUSCULAR | Status: AC | PRN
  Administered 2019-04-24: 40 mg via INTRA_ARTICULAR

## 2019-04-24 MED ORDER — LIDOCAINE HCL 1 % IJ SOLN
2.0000 mL | INTRAMUSCULAR | Status: AC | PRN
  Administered 2019-04-24: 2 mL

## 2019-04-24 NOTE — Progress Notes (Signed)
Office Visit Note   Patient: Elizabeth Roberson           Date of Birth: [birth year]           MRN: [id number] Visit Date: 04/24/2019              Requested by: Celene Squibb, MD [place],  [place] [address] PCP: Celene Squibb, MD   Assessment & Plan: Visit Diagnoses:  1. Chronic pain of left knee     Plan: Impression is left knee end-stage degenerative joint disease.  Patient would like to try a cortisone injection at this point.  We also discussed viscosupplementation injection as well as definitive treatment of a total knee arthroplasty.  She will follow-up with [place] as needed.  Call with concerns or questions in the meantime.  Follow-Up Instructions: Return if symptoms worsen or fail to improve.   Orders:  Orders Placed This Encounter  Procedures  . Large Joint Inj: L knee  . XR KNEE 3 VIEW LEFT   No orders of the defined types were placed in this encounter.     Procedures: Large Joint Inj: L knee on 04/24/2019 9:51 AM Indications: pain Details: 22 G needle, anterolateral approach Medications: 2 mL bupivacaine 0.25 %; 2 mL lidocaine 1 %; 40 mg methylPREDNISolone acetate 40 MG/ML      Clinical Data: No additional findings.   Subjective: Chief Complaint  Patient presents with  . Left Knee - Pain    HPI patient is a pleasant 68 year old female who presents our clinic today with recurrent left knee pain.  History of primarily medial compartment degenerative changes.  She was seen in our office little over a year ago for this.  She has been taking Mobic with mild relief of symptoms since.  She notes a flareup of symptoms a few months ago which got to the point where she was having to use a crutch with ambulation.  Her symptoms have improved, but still notes pain to the entire knee.  The pain she has is intermittent in nature.  Pain is worse when she is standing on her feet for a period of time as well as when she goes from a seated to standing position.   She also occasionally has pain at night when she is lying down in her bed.  She notes that she is taking an occasional Celebrex but due to her history of gastric ulcer has to be very careful.  Review of Systems as detailed in HPI.  All others reviewed and are negative.   Objective: Vital Signs: There were no vitals taken for this visit.  Physical Exam well-developed and well-nourished female in no acute distress.  Alert and oriented x3.  Ortho Exam examination of her left knee reveals a trace effusion.  Varus deformity.  Range of motion 0 to 100 degrees.  Medial joint line tenderness.  Tenderness to the popliteal fossa.  Calf is nontender.  Ligaments are stable.  She is neurovascularly intact distally.  Specialty Comments:  No specialty comments available.  Imaging: Xr Knee 3 View Left  Result Date: 04/24/2019 X-rays demonstrate marked degenerative changes medial compartment with joint space narrowing to the patellofemoral compartment    PMFS History: Patient Active Problem List   Diagnosis Date Noted  . Esophageal dysphagia 09/17/2017  . Dysphagia 09/10/2017  . Hip pain, chronic 12/12/2015  . Hypertension 03/18/2013  . Multiple allergies 03/18/2013   Past Medical History:  Diagnosis Date  .  Allergy   . Dysphagia 09/10/2017  . Hip pain, chronic 12/12/2015   Has bilateral hip for past 20 years when walks more than a few minutes, no loss of ROM  . Hypertension   . Vaginal Pap smear, abnormal   . Vasculitis ([place])     Family History  Problem Relation Age of Onset  . COPD Mother   . Alzheimer's disease Mother   . Hypertension Son     Past Surgical History:  Procedure Laterality Date  . BALLOON DILATION N/A 07/09/2014   Procedure: BALLOON DILATION;  Surgeon: Rogene Houston, MD;  [facility];  Service: Endoscopy;  Laterality: N/A;  . BIOPSY  10/04/2017   Procedure: BIOPSY;  Surgeon: Rogene Houston, MD;  [facility];  Service: Endoscopy;;  gastric   . COLONOSCOPY    . COLONOSCOPY N/A 07/09/2014   Procedure: COLONOSCOPY;  Surgeon: Rogene Houston, MD;  [facility];  Service: Endoscopy;  Laterality: N/A;  730  . ESOPHAGEAL DILATION N/A 10/04/2017   Procedure: ESOPHAGEAL DILATION;  Surgeon: Rogene Houston, MD;  [facility];  Service: Endoscopy;  Laterality: N/A;  . ESOPHAGOGASTRODUODENOSCOPY N/A 07/09/2014   Procedure: ESOPHAGOGASTRODUODENOSCOPY (EGD);  Surgeon: Rogene Houston, MD;  [facility];  Service: Endoscopy;  Laterality: N/A;  . ESOPHAGOGASTRODUODENOSCOPY N/A 10/04/2017   Procedure: ESOPHAGOGASTRODUODENOSCOPY (EGD);  Surgeon: Rogene Houston, MD;  [facility];  Service: Endoscopy;  Laterality: N/A;  8:55  . laser surgery on cervix    . MALONEY DILATION N/A 07/09/2014   Procedure: Venia Minks DILATION;  Surgeon: Rogene Houston, MD;  [facility];  Service: Endoscopy;  Laterality: N/A;  . TUBAL LIGATION     Social History   Occupational History  . Not on file  Tobacco Use  . Smoking status: Never Smoker  . Smokeless tobacco: Never Used  Substance and Sexual Activity  . Alcohol use: No  . Drug use: No  . Sexual activity: Yes    Birth control/protection: Post-menopausal, Surgical    Comment: tubal

## 2019-05-28 DIAGNOSIS — M79605 Pain in left leg: Secondary | ICD-10-CM | POA: Diagnosis not present

## 2019-05-28 DIAGNOSIS — M1991 Primary osteoarthritis, unspecified site: Secondary | ICD-10-CM | POA: Diagnosis not present

## 2019-05-28 DIAGNOSIS — H05222 Edema of left orbit: Secondary | ICD-10-CM | POA: Diagnosis not present

## 2019-11-27 ENCOUNTER — Other Ambulatory Visit (HOSPITAL_COMMUNITY): Payer: Self-pay | Admitting: Obstetrics & Gynecology

## 2019-11-27 DIAGNOSIS — Z1231 Encounter for screening mammogram for malignant neoplasm of breast: Secondary | ICD-10-CM

## 2019-12-09 ENCOUNTER — Other Ambulatory Visit: Payer: Self-pay

## 2019-12-09 ENCOUNTER — Ambulatory Visit (HOSPITAL_COMMUNITY)
Admission: RE | Admit: 2019-12-09 | Discharge: 2019-12-09 | Disposition: A | Discharge status: Home / Self Care | Payer: Medicare Other | Source: Ambulatory Visit | Attending: Obstetrics & Gynecology | Admitting: Obstetrics & Gynecology

## 2019-12-09 DIAGNOSIS — Z1231 Encounter for screening mammogram for malignant neoplasm of breast: Secondary | ICD-10-CM | POA: Diagnosis not present

## 2020-03-16 DIAGNOSIS — H02834 Dermatochalasis of left upper eyelid: Secondary | ICD-10-CM | POA: Diagnosis not present

## 2020-03-16 DIAGNOSIS — H02831 Dermatochalasis of right upper eyelid: Secondary | ICD-10-CM | POA: Diagnosis not present

## 2020-03-16 DIAGNOSIS — H2513 Age-related nuclear cataract, bilateral: Secondary | ICD-10-CM | POA: Diagnosis not present

## 2020-03-16 DIAGNOSIS — H524 Presbyopia: Secondary | ICD-10-CM | POA: Diagnosis not present

## 2020-05-19 DIAGNOSIS — R1310 Dysphagia, unspecified: Secondary | ICD-10-CM | POA: Diagnosis not present

## 2020-05-19 DIAGNOSIS — Z6832 Body mass index (BMI) 32.0-32.9, adult: Secondary | ICD-10-CM | POA: Diagnosis not present

## 2020-05-19 DIAGNOSIS — K222 Esophageal obstruction: Secondary | ICD-10-CM | POA: Diagnosis not present

## 2020-05-19 DIAGNOSIS — M2550 Pain in unspecified joint: Secondary | ICD-10-CM | POA: Diagnosis not present

## 2020-05-19 DIAGNOSIS — H05222 Edema of left orbit: Secondary | ICD-10-CM | POA: Diagnosis not present

## 2020-05-19 DIAGNOSIS — K219 Gastro-esophageal reflux disease without esophagitis: Secondary | ICD-10-CM | POA: Diagnosis not present

## 2020-05-19 DIAGNOSIS — K279 Peptic ulcer, site unspecified, unspecified as acute or chronic, without hemorrhage or perforation: Secondary | ICD-10-CM | POA: Diagnosis not present

## 2020-05-19 DIAGNOSIS — J309 Allergic rhinitis, unspecified: Secondary | ICD-10-CM | POA: Diagnosis not present

## 2020-05-19 DIAGNOSIS — M79605 Pain in left leg: Secondary | ICD-10-CM | POA: Diagnosis not present

## 2020-05-19 DIAGNOSIS — M5440 Lumbago with sciatica, unspecified side: Secondary | ICD-10-CM | POA: Diagnosis not present

## 2020-05-19 DIAGNOSIS — S61559A Open bite of unspecified wrist, initial encounter: Secondary | ICD-10-CM | POA: Diagnosis not present

## 2020-05-19 DIAGNOSIS — J060 Acute laryngopharyngitis: Secondary | ICD-10-CM | POA: Diagnosis not present

## 2020-05-25 DIAGNOSIS — K219 Gastro-esophageal reflux disease without esophagitis: Secondary | ICD-10-CM | POA: Diagnosis not present

## 2020-05-25 DIAGNOSIS — J309 Allergic rhinitis, unspecified: Secondary | ICD-10-CM | POA: Diagnosis not present

## 2020-05-25 DIAGNOSIS — M1991 Primary osteoarthritis, unspecified site: Secondary | ICD-10-CM | POA: Diagnosis not present

## 2020-05-25 DIAGNOSIS — I10 Essential (primary) hypertension: Secondary | ICD-10-CM | POA: Diagnosis not present

## 2020-05-25 DIAGNOSIS — S51852A Open bite of left forearm, initial encounter: Secondary | ICD-10-CM | POA: Diagnosis not present

## 2020-05-25 DIAGNOSIS — S61559A Open bite of unspecified wrist, initial encounter: Secondary | ICD-10-CM | POA: Diagnosis not present

## 2020-05-27 ENCOUNTER — Other Ambulatory Visit (HOSPITAL_COMMUNITY): Payer: Self-pay | Admitting: Internal Medicine

## 2020-05-27 DIAGNOSIS — Z13820 Encounter for screening for osteoporosis: Secondary | ICD-10-CM

## 2020-06-02 ENCOUNTER — Ambulatory Visit (HOSPITAL_COMMUNITY)
Admission: RE | Admit: 2020-06-02 | Discharge: 2020-06-02 | Disposition: A | Discharge status: Home / Self Care | Payer: Medicare Other | Source: Ambulatory Visit | Attending: Internal Medicine | Admitting: Internal Medicine

## 2020-06-02 ENCOUNTER — Other Ambulatory Visit: Payer: Self-pay

## 2020-06-02 DIAGNOSIS — Z13820 Encounter for screening for osteoporosis: Secondary | ICD-10-CM | POA: Diagnosis not present

## 2020-06-02 DIAGNOSIS — Z780 Asymptomatic menopausal state: Secondary | ICD-10-CM | POA: Diagnosis not present

## 2020-06-02 DIAGNOSIS — M85852 Other specified disorders of bone density and structure, left thigh: Secondary | ICD-10-CM | POA: Diagnosis not present

## 2020-07-07 DIAGNOSIS — K222 Esophageal obstruction: Secondary | ICD-10-CM | POA: Diagnosis not present

## 2020-07-07 DIAGNOSIS — H05222 Edema of left orbit: Secondary | ICD-10-CM | POA: Diagnosis not present

## 2020-07-07 DIAGNOSIS — J309 Allergic rhinitis, unspecified: Secondary | ICD-10-CM | POA: Diagnosis not present

## 2020-07-07 DIAGNOSIS — S61559A Open bite of unspecified wrist, initial encounter: Secondary | ICD-10-CM | POA: Diagnosis not present

## 2020-07-07 DIAGNOSIS — K219 Gastro-esophageal reflux disease without esophagitis: Secondary | ICD-10-CM | POA: Diagnosis not present

## 2020-07-07 DIAGNOSIS — M25512 Pain in left shoulder: Secondary | ICD-10-CM | POA: Diagnosis not present

## 2020-07-07 DIAGNOSIS — M1991 Primary osteoarthritis, unspecified site: Secondary | ICD-10-CM | POA: Diagnosis not present

## 2020-07-07 DIAGNOSIS — K279 Peptic ulcer, site unspecified, unspecified as acute or chronic, without hemorrhage or perforation: Secondary | ICD-10-CM | POA: Diagnosis not present

## 2020-07-07 DIAGNOSIS — Z6832 Body mass index (BMI) 32.0-32.9, adult: Secondary | ICD-10-CM | POA: Diagnosis not present

## 2020-07-07 DIAGNOSIS — M5440 Lumbago with sciatica, unspecified side: Secondary | ICD-10-CM | POA: Diagnosis not present

## 2020-07-07 DIAGNOSIS — I10 Essential (primary) hypertension: Secondary | ICD-10-CM | POA: Diagnosis not present

## 2020-07-07 DIAGNOSIS — G501 Atypical facial pain: Secondary | ICD-10-CM | POA: Diagnosis not present

## 2020-07-07 DIAGNOSIS — J060 Acute laryngopharyngitis: Secondary | ICD-10-CM | POA: Diagnosis not present

## 2020-07-07 DIAGNOSIS — Z136 Encounter for screening for cardiovascular disorders: Secondary | ICD-10-CM | POA: Diagnosis not present

## 2020-07-07 DIAGNOSIS — S51852A Open bite of left forearm, initial encounter: Secondary | ICD-10-CM | POA: Diagnosis not present

## 2020-07-07 DIAGNOSIS — M2550 Pain in unspecified joint: Secondary | ICD-10-CM | POA: Diagnosis not present

## 2020-07-07 DIAGNOSIS — H6122 Impacted cerumen, left ear: Secondary | ICD-10-CM | POA: Diagnosis not present

## 2020-07-07 DIAGNOSIS — R1310 Dysphagia, unspecified: Secondary | ICD-10-CM | POA: Diagnosis not present

## 2020-07-07 DIAGNOSIS — M79605 Pain in left leg: Secondary | ICD-10-CM | POA: Diagnosis not present

## 2020-10-21 ENCOUNTER — Other Ambulatory Visit: Payer: Medicare Other | Admitting: Obstetrics & Gynecology

## 2020-11-11 ENCOUNTER — Ambulatory Visit (INDEPENDENT_AMBULATORY_CARE_PROVIDER_SITE_OTHER): Payer: Medicare Other | Admitting: Obstetrics & Gynecology

## 2020-11-11 ENCOUNTER — Other Ambulatory Visit (HOSPITAL_COMMUNITY)
Admission: RE | Admit: 2020-11-11 | Discharge: 2020-11-11 | Disposition: A | Discharge status: Home / Self Care | Payer: Medicare Other | Source: Ambulatory Visit | Attending: Obstetrics & Gynecology | Admitting: Obstetrics & Gynecology

## 2020-11-11 ENCOUNTER — Encounter: Payer: Self-pay | Admitting: Obstetrics & Gynecology

## 2020-11-11 ENCOUNTER — Other Ambulatory Visit: Payer: Self-pay

## 2020-11-11 VITALS — BP 133/87 | HR 69 | Ht 64.0 in | Wt 199.0 lb

## 2020-11-11 DIAGNOSIS — Z1231 Encounter for screening mammogram for malignant neoplasm of breast: Secondary | ICD-10-CM | POA: Diagnosis not present

## 2020-11-11 DIAGNOSIS — Z1212 Encounter for screening for malignant neoplasm of rectum: Secondary | ICD-10-CM

## 2020-11-11 DIAGNOSIS — Z01419 Encounter for gynecological examination (general) (routine) without abnormal findings: Secondary | ICD-10-CM

## 2020-11-11 DIAGNOSIS — Z124 Encounter for screening for malignant neoplasm of cervix: Secondary | ICD-10-CM | POA: Diagnosis not present

## 2020-11-11 DIAGNOSIS — Z1211 Encounter for screening for malignant neoplasm of colon: Secondary | ICD-10-CM | POA: Diagnosis not present

## 2020-11-11 DIAGNOSIS — Z1151 Encounter for screening for human papillomavirus (HPV): Secondary | ICD-10-CM | POA: Insufficient documentation

## 2020-11-11 LAB — HEMOCCULT GUIAC POC 1CARD (OFFICE): Fecal Occult Blood, POC: NEGATIVE

## 2020-11-11 NOTE — Progress Notes (Signed)
Subjective:     Elizabeth Roberson is a 70 y.o. female here for a routine exam.  No LMP recorded. Patient is postmenopausal. G3P3 Birth Control Method:  Post menopausal Menstrual Calendar(currently): amenorrheic  Current complaints: back hip issues/arthritis.   Current acute medical issues:  none   Recent Gynecologic History No LMP recorded. Patient is postmenopausal. Last Pap: 2018,  normal Last mammogram: 5/21,  normal  Past Medical History:  Diagnosis Date  . Allergy   . Dysphagia 09/10/2017  . Hip pain, chronic 12/12/2015   Has bilateral hip for past 20 years when walks more than a few minutes, no loss of ROM  . Hypertension   . Vaginal Pap smear, abnormal   . Vasculitis ([place])     Past Surgical History:  Procedure Laterality Date  . BALLOON DILATION N/A 07/09/2014   Procedure: BALLOON DILATION;  Surgeon: Rogene Houston, MD;  [facility];  Service: Endoscopy;  Laterality: N/A;  . BIOPSY  10/04/2017   Procedure: BIOPSY;  Surgeon: Rogene Houston, MD;  [facility];  Service: Endoscopy;;  gastric  . COLONOSCOPY    . COLONOSCOPY N/A 07/09/2014   Procedure: COLONOSCOPY;  Surgeon: Rogene Houston, MD;  [facility];  Service: Endoscopy;  Laterality: N/A;  730  . ESOPHAGEAL DILATION N/A 10/04/2017   Procedure: ESOPHAGEAL DILATION;  Surgeon: Rogene Houston, MD;  [facility];  Service: Endoscopy;  Laterality: N/A;  . ESOPHAGOGASTRODUODENOSCOPY N/A 07/09/2014   Procedure: ESOPHAGOGASTRODUODENOSCOPY (EGD);  Surgeon: Rogene Houston, MD;  [facility];  Service: Endoscopy;  Laterality: N/A;  . ESOPHAGOGASTRODUODENOSCOPY N/A 10/04/2017   Procedure: ESOPHAGOGASTRODUODENOSCOPY (EGD);  Surgeon: Rogene Houston, MD;  [facility];  Service: Endoscopy;  Laterality: N/A;  8:55  . laser surgery on cervix    . MALONEY DILATION N/A 07/09/2014   Procedure: Venia Minks DILATION;  Surgeon: Rogene Houston, MD;  [facility];  Service: Endoscopy;  Laterality: N/A;  . TUBAL LIGATION      OB History    Gravida  3   Para  3   Term      Preterm      AB      Living  3     SAB      IAB      Ectopic      Multiple      Live Births              Social History   Socioeconomic History  . Marital status: Married    Spouse name: Not on file  . Number of children: Not on file  . Years of education: Not on file  . Highest education level: Not on file  Occupational History  . Not on file  Tobacco Use  . Smoking status: Never Smoker  . Smokeless tobacco: Never Used  Vaping Use  . Vaping Use: Never used  Substance and Sexual Activity  . Alcohol use: No  . Drug use: No  . Sexual activity: Yes    Birth control/protection: Post-menopausal, Surgical    Comment: tubal  Other Topics Concern  . Not on file  Social History Narrative  . Not on file   Social Determinants of Health   Financial Resource Strain: Low Risk   . Difficulty of Paying Living Expenses: Not hard at all  Food Insecurity: No Food Insecurity  . Worried About Charity fundraiser in the Last Year:  Never true  . Ran Out of Food in the Last Year: Never true  Transportation Needs: No Transportation Needs  . Lack of Transportation (Medical): No  . Lack of Transportation (Non-Medical): No  Physical Activity: Inactive  . Days of Exercise per Week: 0 days  . Minutes of Exercise per Session: 0 min  Stress: No Stress Concern Present  . Feeling of Stress : Not at all  Social Connections: Socially Integrated  . Frequency of Communication with Friends and Family: More than three times a week  . Frequency of Social Gatherings with Friends and Family: Three times a week  . Attends Religious Services: More than 4 times per year  . Active Member of Clubs or Organizations: Yes  . Attends Archivist Meetings: More than 4 times per year  . Marital Status: Married    Family History  Problem Relation Age of Onset  .  COPD Mother   . Alzheimer's disease Mother   . Hypertension Son      Current Outpatient Medications:  .  acetaminophen (TYLENOL) 500 MG tablet, Take 500 mg by mouth every 6 (six) hours as needed for moderate pain or headache., Disp: , Rfl:  .  albuterol (PROVENTIL HFA;VENTOLIN HFA) 108 (90 BASE) MCG/ACT inhaler, Inhale 2 puffs into the lungs every 6 (six) hours as needed for wheezing or shortness of breath., Disp: , Rfl:  .  celecoxib (CELEBREX) 200 MG capsule, Take 200 mg by mouth daily., Disp: , Rfl:  .  ketotifen (ZADITOR) 0.025 % ophthalmic solution, Place 1 drop into both eyes 2 (two) times daily as needed (for dry eyes)., Disp: , Rfl:  .  levocetirizine (XYZAL) 5 MG tablet, Take 5 mg by mouth every evening., Disp: , Rfl:  .  losartan (COZAAR) 50 MG tablet, Take 50 mg by mouth daily., Disp: , Rfl: 1 .  pantoprazole (PROTONIX) 40 MG tablet, Take 1 tablet (40 mg total) by mouth 2 (two) times daily before a meal., Disp: 60 tablet, Rfl: 2  Review of Systems  Review of Systems  Constitutional: Negative for fever, chills, weight loss, malaise/fatigue and diaphoresis.  HENT: Negative for hearing loss, ear pain, nosebleeds, congestion, sore throat, neck pain, tinnitus and ear discharge.   Eyes: Negative for blurred vision, double vision, photophobia, pain, discharge and redness.  Respiratory: Negative for cough, hemoptysis, sputum production, shortness of breath, wheezing and stridor.   Cardiovascular: Negative for chest pain, palpitations, orthopnea, claudication, leg swelling and PND.  Gastrointestinal: negative for abdominal pain. Negative for heartburn, nausea, vomiting, diarrhea, constipation, blood in stool and melena.  Genitourinary: Negative for dysuria, urgency, frequency, hematuria and flank pain.  Musculoskeletal: Negative for myalgias, back pain, joint pain and falls.  Skin: Negative for itching and rash.  Neurological: Negative for dizziness, tingling, tremors, sensory change,  speech change, focal weakness, seizures, loss of consciousness, weakness and headaches.  Endo/Heme/Allergies: Negative for environmental allergies and polydipsia. Does not bruise/bleed easily.  Psychiatric/Behavioral: Negative for depression, suicidal ideas, hallucinations, memory loss and substance abuse. The patient is not nervous/anxious and does not have insomnia.        Objective:  Blood pressure 133/87, pulse 69, height 5\' 4"  (1.626 m), weight 199 lb (90.3 kg).   Physical Exam  Vitals reviewed. Constitutional: She is oriented to person, place, and time. She appears well-developed and well-nourished.  HENT:  Head: Normocephalic and atraumatic.        Right Ear: External ear normal.  Left Ear: External ear normal.  Nose: Nose  normal.  Mouth/Throat: Oropharynx is clear and moist.  Eyes: Conjunctivae and EOM are normal. Pupils are equal, round, and reactive to light. Right eye exhibits no discharge. Left eye exhibits no discharge. No scleral icterus.  Neck: Normal range of motion. Neck supple. No tracheal deviation present. No thyromegaly present.  Cardiovascular: Normal rate, regular rhythm, normal heart sounds and intact distal pulses.  Exam reveals no gallop and no friction rub.   No murmur heard. Respiratory: Effort normal and breath sounds normal. No respiratory distress. She has no wheezes. She has no rales. She exhibits no tenderness.  GI: Soft. Bowel sounds are normal. She exhibits no distension and no mass. There is no tenderness. There is no rebound and no guarding.  Genitourinary:  Breasts no masses skin changes or nipple changes bilaterally      Vulva is normal without lesions Vagina is pink moist without discharge Cervix normal in appearance and pap is done Uterus is normal size shape and contour Adnexa is negative with normal sized ovaries  {Rectal    hemoccult negative, normal tone, no masses  Musculoskeletal: Normal range of motion. She exhibits no edema and no  tenderness.  Neurological: She is alert and oriented to person, place, and time. She has normal reflexes. She displays normal reflexes. No cranial nerve deficit. She exhibits normal muscle tone. Coordination normal.  Skin: Skin is warm and dry. No rash noted. No erythema. No pallor.  Psychiatric: She has a normal mood and affect. Her behavior is normal. Judgment and thought content normal.       Medications Ordered at today's visit: No orders of the defined types were placed in this encounter.   Other orders placed at today's visit: Orders Placed This Encounter  Procedures  . MM 3D SCREEN BREAST BILATERAL  . POCT occult blood stool      Assessment:    Normal Gyn exam.    Plan:    Contraception: post menopausal status. Hormone replacement therapy: none. Mammogram ordered. Follow up in: 3 years.     Return in about 3 years (around 11/12/2023) for yearly.

## 2020-11-16 LAB — CYTOLOGY - PAP
Comment: NEGATIVE
Diagnosis: NEGATIVE
High risk HPV: NEGATIVE

## 2020-11-24 DIAGNOSIS — R5383 Other fatigue: Secondary | ICD-10-CM | POA: Diagnosis not present

## 2020-11-24 DIAGNOSIS — I10 Essential (primary) hypertension: Secondary | ICD-10-CM | POA: Diagnosis not present

## 2020-11-24 DIAGNOSIS — K219 Gastro-esophageal reflux disease without esophagitis: Secondary | ICD-10-CM | POA: Diagnosis not present

## 2020-11-24 DIAGNOSIS — J309 Allergic rhinitis, unspecified: Secondary | ICD-10-CM | POA: Diagnosis not present

## 2020-11-24 DIAGNOSIS — K862 Cyst of pancreas: Secondary | ICD-10-CM | POA: Diagnosis not present

## 2020-11-24 DIAGNOSIS — J45909 Unspecified asthma, uncomplicated: Secondary | ICD-10-CM | POA: Diagnosis not present

## 2020-11-24 DIAGNOSIS — M159 Polyosteoarthritis, unspecified: Secondary | ICD-10-CM | POA: Diagnosis not present

## 2020-11-25 ENCOUNTER — Other Ambulatory Visit: Payer: Self-pay | Admitting: Internal Medicine

## 2020-11-25 ENCOUNTER — Other Ambulatory Visit (HOSPITAL_COMMUNITY): Payer: Self-pay | Admitting: Internal Medicine

## 2020-11-25 DIAGNOSIS — K862 Cyst of pancreas: Secondary | ICD-10-CM

## 2020-12-12 ENCOUNTER — Other Ambulatory Visit (HOSPITAL_COMMUNITY): Payer: Self-pay | Admitting: Internal Medicine

## 2020-12-12 ENCOUNTER — Ambulatory Visit (HOSPITAL_COMMUNITY)
Admission: RE | Admit: 2020-12-12 | Discharge: 2020-12-12 | Disposition: A | Discharge status: Home / Self Care | Payer: Medicare Other | Source: Ambulatory Visit | Attending: Internal Medicine | Admitting: Internal Medicine

## 2020-12-12 ENCOUNTER — Other Ambulatory Visit: Payer: Self-pay

## 2020-12-12 DIAGNOSIS — K862 Cyst of pancreas: Secondary | ICD-10-CM | POA: Diagnosis not present

## 2020-12-12 DIAGNOSIS — N289 Disorder of kidney and ureter, unspecified: Secondary | ICD-10-CM | POA: Diagnosis not present

## 2020-12-12 DIAGNOSIS — R932 Abnormal findings on diagnostic imaging of liver and biliary tract: Secondary | ICD-10-CM | POA: Diagnosis not present

## 2020-12-12 MED ORDER — GADOBUTROL 1 MMOL/ML IV SOLN
10.0000 mL | Freq: Once | INTRAVENOUS | Status: AC | PRN
  Administered 2020-12-12: 10 mL via INTRAVENOUS

## 2020-12-15 ENCOUNTER — Ambulatory Visit (INDEPENDENT_AMBULATORY_CARE_PROVIDER_SITE_OTHER): Payer: Medicare Other | Admitting: Podiatry

## 2020-12-15 ENCOUNTER — Ambulatory Visit (HOSPITAL_COMMUNITY)
Admission: RE | Admit: 2020-12-15 | Discharge: 2020-12-15 | Disposition: A | Discharge status: Home / Self Care | Payer: Medicare Other | Source: Ambulatory Visit | Attending: Obstetrics & Gynecology | Admitting: Obstetrics & Gynecology

## 2020-12-15 ENCOUNTER — Ambulatory Visit (INDEPENDENT_AMBULATORY_CARE_PROVIDER_SITE_OTHER): Payer: Medicare Other

## 2020-12-15 ENCOUNTER — Other Ambulatory Visit: Payer: Self-pay

## 2020-12-15 DIAGNOSIS — M79671 Pain in right foot: Secondary | ICD-10-CM | POA: Diagnosis not present

## 2020-12-15 DIAGNOSIS — M778 Other enthesopathies, not elsewhere classified: Secondary | ICD-10-CM

## 2020-12-15 DIAGNOSIS — M79672 Pain in left foot: Secondary | ICD-10-CM

## 2020-12-15 DIAGNOSIS — M722 Plantar fascial fibromatosis: Secondary | ICD-10-CM | POA: Diagnosis not present

## 2020-12-15 DIAGNOSIS — Z1231 Encounter for screening mammogram for malignant neoplasm of breast: Secondary | ICD-10-CM | POA: Diagnosis not present

## 2020-12-15 MED ORDER — METHYLPREDNISOLONE 4 MG PO TBPK: ORAL_TABLET | ORAL | 0 refills | Status: DC

## 2020-12-15 NOTE — Patient Instructions (Signed)
For instructions on how to put on your Plantar Fascial Brace, please visit [email or web address]   Plantar Fasciitis (Heel Spur Syndrome) with Rehab The plantar fascia is a fibrous, ligament-like, soft-tissue structure that spans the bottom of the foot. Plantar fasciitis is a condition that causes pain in the foot due to inflammation of the tissue. SYMPTOMS   Pain and tenderness on the underneath side of the foot.  Pain that worsens with standing or walking. CAUSES  Plantar fasciitis is caused by irritation and injury to the plantar fascia on the underneath side of the foot. Common mechanisms of injury include:  Direct trauma to bottom of the foot.  Damage to a small nerve that runs under the foot where the main fascia attaches to the heel bone.  Stress placed on the plantar fascia due to bone spurs. RISK INCREASES WITH:   Activities that place stress on the plantar fascia (running, jumping, pivoting, or cutting).  Poor strength and flexibility.  Improperly fitted shoes.  Tight calf muscles.  Flat feet.  Failure to warm-up properly before activity.  Obesity. PREVENTION  Warm up and stretch properly before activity.  Allow for adequate recovery between workouts.  Maintain physical fitness:  Strength, flexibility, and endurance.  Cardiovascular fitness.  Maintain a health body weight.  Avoid stress on the plantar fascia.  Wear properly fitted shoes, including arch supports for individuals who have flat feet.  PROGNOSIS  If treated properly, then the symptoms of plantar fasciitis usually resolve without surgery. However, occasionally surgery is necessary.  RELATED COMPLICATIONS   Recurrent symptoms that may result in a chronic condition.  Problems of the lower back that are caused by compensating for the injury, such as limping.  Pain or weakness of the foot during push-off following surgery.  Chronic inflammation, scarring, and partial or complete  fascia tear, occurring more often from repeated injections.  TREATMENT  Treatment initially involves the use of ice and medication to help reduce pain and inflammation. The use of strengthening and stretching exercises may help reduce pain with activity, especially stretches of the Achilles tendon. These exercises may be performed at home or with a therapist. Your caregiver may recommend that you use heel cups of arch supports to help reduce stress on the plantar fascia. Occasionally, corticosteroid injections are given to reduce inflammation. If symptoms persist for greater than 6 months despite non-surgical (conservative), then surgery may be recommended.   MEDICATION   If pain medication is necessary, then nonsteroidal anti-inflammatory medications, such as aspirin and ibuprofen, or other minor pain relievers, such as acetaminophen, are often recommended.  Do not take pain medication within 7 days before surgery.  Prescription pain relievers may be given if deemed necessary by your caregiver. Use only as directed and only as much as you need.  Corticosteroid injections may be given by your caregiver. These injections should be reserved for the most serious cases, because they may only be given a certain number of times.  HEAT AND COLD  Cold treatment (icing) relieves pain and reduces inflammation. Cold treatment should be applied for 10 to 15 minutes every 2 to 3 hours for inflammation and pain and immediately after any activity that aggravates your symptoms. Use ice packs or massage the area with a piece of ice (ice massage).  Heat treatment may be used prior to performing the stretching and strengthening activities prescribed by your caregiver, physical therapist, or athletic trainer. Use a heat pack or soak the injury in warm water.  SEEK IMMEDIATE MEDICAL   CARE IF:  Treatment seems to offer no benefit, or the condition worsens.  Any medications produce adverse side effects.   EXERCISES- RANGE OF MOTION (ROM) AND STRETCHING EXERCISES - Plantar Fasciitis (Heel Spur Syndrome) These exercises may help you when beginning to rehabilitate your injury. Your symptoms may resolve with or without further involvement from your physician, physical therapist or athletic trainer. While completing these exercises, remember:   Restoring tissue flexibility helps normal motion to return to the joints. This allows healthier, less painful movement and activity.  An effective stretch should be held for at least 30 seconds.  A stretch should never be painful. You should only feel a gentle lengthening or release in the stretched tissue.  RANGE OF MOTION - Toe Extension, Flexion  Sit with your right / left leg crossed over your opposite knee.  Grasp your toes and gently pull them back toward the top of your foot. You should feel a stretch on the bottom of your toes and/or foot.  Hold this stretch for 10 seconds.  Now, gently pull your toes toward the bottom of your foot. You should feel a stretch on the top of your toes and or foot.  Hold this stretch for 10 seconds. Repeat  times. Complete this stretch 3 times per day.   RANGE OF MOTION - Ankle Dorsiflexion, Active Assisted  Remove shoes and sit on a chair that is preferably not on a carpeted surface.  Place right / left foot under knee. Extend your opposite leg for support.  Keeping your heel down, slide your right / left foot back toward the chair until you feel a stretch at your ankle or calf. If you do not feel a stretch, slide your bottom forward to the edge of the chair, while still keeping your heel down.  Hold this stretch for 10 seconds. Repeat 3 times. Complete this stretch 2 times per day.   STRETCH  Gastroc, Standing  Place hands on wall.  Extend right / left leg, keeping the front knee somewhat bent.  Slightly point your toes inward on your back foot.  Keeping your right / left heel on the floor and your  knee straight, shift your weight toward the wall, not allowing your back to arch.  You should feel a gentle stretch in the right / left calf. Hold this position for 10 seconds. Repeat 3 times. Complete this stretch 2 times per day.  STRETCH  Soleus, Standing  Place hands on wall.  Extend right / left leg, keeping the other knee somewhat bent.  Slightly point your toes inward on your back foot.  Keep your right / left heel on the floor, bend your back knee, and slightly shift your weight over the back leg so that you feel a gentle stretch deep in your back calf.  Hold this position for 10 seconds. Repeat 3 times. Complete this stretch 2 times per day.  STRETCH  Gastrocsoleus, Standing  Note: This exercise can place a lot of stress on your foot and ankle. Please complete this exercise only if specifically instructed by your caregiver.   Place the ball of your right / left foot on a step, keeping your other foot firmly on the same step.  Hold on to the wall or a rail for balance.  Slowly lift your other foot, allowing your body weight to press your heel down over the edge of the step.  You should feel a stretch in your right / left calf.  Hold this   position for 10 seconds.  Repeat this exercise with a slight bend in your right / left knee. Repeat 3 times. Complete this stretch 2 times per day.   STRENGTHENING EXERCISES - Plantar Fasciitis (Heel Spur Syndrome)  These exercises may help you when beginning to rehabilitate your injury. They may resolve your symptoms with or without further involvement from your physician, physical therapist or athletic trainer. While completing these exercises, remember:   Muscles can gain both the endurance and the strength needed for everyday activities through controlled exercises.  Complete these exercises as instructed by your physician, physical therapist or athletic trainer. Progress the resistance and repetitions only as guided.  STRENGTH -  Towel Curls  Sit in a chair positioned on a non-carpeted surface.  Place your foot on a towel, keeping your heel on the floor.  Pull the towel toward your heel by only curling your toes. Keep your heel on the floor. Repeat 3 times. Complete this exercise 2 times per day.  STRENGTH - Ankle Inversion  Secure one end of a rubber exercise band/tubing to a fixed object (table, pole). Loop the other end around your foot just before your toes.  Place your fists between your knees. This will focus your strengthening at your ankle.  Slowly, pull your big toe up and in, making sure the band/tubing is positioned to resist the entire motion.  Hold this position for 10 seconds.  Have your muscles resist the band/tubing as it slowly pulls your foot back to the starting position. Repeat 3 times. Complete this exercises 2 times per day.  Document Released: 07/09/2005 Document Revised: 10/01/2011 Document Reviewed: 10/21/2008 [employer] Patient Information 2014 [employer]. 

## 2020-12-18 NOTE — Progress Notes (Signed)
Subjective:   Patient ID: Elizabeth Roberson, female   DOB: 70 y.o.   MRN: [id number]   HPI 70 year old female presents the office today for concerns of bilateral heel pain with the right side worse than left.  She describes discomfort of the heel as well as the arch of the foot.  Occasionally some pain in the lateral aspect of the foot pointing to the left sinus tarsi area.  She saw her primary care physician was prescribed Celebrex which helped some.  She gets pain if she states her symptoms seems becomes worse to walk or after standing all day.  Is been getting worse over the last 6 months without any recent injury.  She previously has seen another podiatrist previously and had a right first MPJ injection which did help.   Review of Systems  All other systems reviewed and are negative.  Past Medical History:  Diagnosis Date  . Allergy   . Dysphagia 09/10/2017  . Hip pain, chronic 12/12/2015   Has bilateral hip for past 20 years when walks more than a few minutes, no loss of ROM  . Hypertension   . Vaginal Pap smear, abnormal   . Vasculitis ([place])     Past Surgical History:  Procedure Laterality Date  . BALLOON DILATION N/A 07/09/2014   Procedure: BALLOON DILATION;  Surgeon: Rogene Houston, MD;  [facility];  Service: Endoscopy;  Laterality: N/A;  . BIOPSY  10/04/2017   Procedure: BIOPSY;  Surgeon: Rogene Houston, MD;  [facility];  Service: Endoscopy;;  gastric  . COLONOSCOPY    . COLONOSCOPY N/A 07/09/2014   Procedure: COLONOSCOPY;  Surgeon: Rogene Houston, MD;  [facility];  Service: Endoscopy;  Laterality: N/A;  730  . ESOPHAGEAL DILATION N/A 10/04/2017   Procedure: ESOPHAGEAL DILATION;  Surgeon: Rogene Houston, MD;  [facility];  Service: Endoscopy;  Laterality: N/A;  . ESOPHAGOGASTRODUODENOSCOPY N/A 07/09/2014   Procedure: ESOPHAGOGASTRODUODENOSCOPY (EGD);  Surgeon: Rogene Houston, MD;  [facility];  Service:  Endoscopy;  Laterality: N/A;  . ESOPHAGOGASTRODUODENOSCOPY N/A 10/04/2017   Procedure: ESOPHAGOGASTRODUODENOSCOPY (EGD);  Surgeon: Rogene Houston, MD;  [facility];  Service: Endoscopy;  Laterality: N/A;  8:55  . laser surgery on cervix    . MALONEY DILATION N/A 07/09/2014   Procedure: Venia Minks DILATION;  Surgeon: Rogene Houston, MD;  [facility];  Service: Endoscopy;  Laterality: N/A;  . TUBAL LIGATION       Current Outpatient Medications:  .  methylPREDNISolone (MEDROL DOSEPAK) 4 MG TBPK tablet, Take as directed, Disp: 21 tablet, Rfl: 0 .  acetaminophen (TYLENOL) 500 MG tablet, Take 500 mg by mouth every 6 (six) hours as needed for moderate pain or headache., Disp: , Rfl:  .  albuterol (PROVENTIL HFA;VENTOLIN HFA) 108 (90 BASE) MCG/ACT inhaler, Inhale 2 puffs into the lungs every 6 (six) hours as needed for wheezing or shortness of breath., Disp: , Rfl:  .  celecoxib (CELEBREX) 200 MG capsule, Take 200 mg by mouth daily., Disp: , Rfl:  .  ketotifen (ZADITOR) 0.025 % ophthalmic solution, Place 1 drop into both eyes 2 (two) times daily as needed (for dry eyes)., Disp: , Rfl:  .  levocetirizine (XYZAL) 5 MG tablet, Take 5 mg by mouth every evening., Disp: , Rfl:  .  losartan (COZAAR) 50 MG tablet, Take 50 mg by mouth daily., Disp: , Rfl: 1 .  pantoprazole (PROTONIX) 40 MG  tablet, Take 1 tablet (40 mg total) by mouth 2 (two) times daily before a meal., Disp: 60 tablet, Rfl: 2  No Known Allergies      Objective:  Physical Exam  General: AAO x3, NAD  Dermatological: Skin is warm, dry and supple bilateral.  There are no open sores, no preulcerative lesions, no rash or signs of infection present.  Vascular: Dorsalis Pedis artery and Posterior Tibial artery pedal pulses are 2/4 bilateral with immedate capillary fill time. here is no pain with calf compression, swelling, warmth, erythema.   Neruologic: Grossly intact via light touch bilateral.  Negative Tinel  sign.  Musculoskeletal: There is tenderness palpation on plantar medial tubercle of the calcaneus at the insertion of plantar fascial on the right side worse than left.  Mild discomfort of the medial band plantar fashion the arch of the foot.  No pain with lateral compression of calcaneus.  No pain with Achilles tendon bilaterally.  Mild discomfort with localized edema to the lateral aspect of Sinus tarsi.  No pain to the tibia, fibula.  No proximal pain.  Muscular strength 5/5 in all groups tested bilateral.  Gait: Unassisted, Nonantalgic.       Assessment:   Bilateral heel pain, plantar fasciitis, capsulitis     Plan:  -Treatment options discussed including all alternatives, risks, and complications -Etiology of symptoms were discussed -X-rays were obtained and reviewed with the patient.  There is no evidence of acute fracture or stress fracture identified today. -Medrol Dosepak prescribed.  Hold off on Celebrex while taking this and then she can restart it.  Discussed steroid injection today. -Plantar fascial brace was dispensed x2 help support and offload the plantar fascial -Stretching, icing daily. -Discussed shoe modifications and orthotics  Trula Slade DPM

## 2021-01-25 DIAGNOSIS — R059 Cough, unspecified: Secondary | ICD-10-CM | POA: Diagnosis not present

## 2021-01-25 DIAGNOSIS — I10 Essential (primary) hypertension: Secondary | ICD-10-CM | POA: Diagnosis not present

## 2021-01-25 DIAGNOSIS — J069 Acute upper respiratory infection, unspecified: Secondary | ICD-10-CM | POA: Diagnosis not present

## 2021-03-29 DIAGNOSIS — H524 Presbyopia: Secondary | ICD-10-CM | POA: Diagnosis not present

## 2021-03-29 DIAGNOSIS — H5203 Hypermetropia, bilateral: Secondary | ICD-10-CM | POA: Diagnosis not present

## 2021-03-29 DIAGNOSIS — H2513 Age-related nuclear cataract, bilateral: Secondary | ICD-10-CM | POA: Diagnosis not present

## 2021-05-23 DIAGNOSIS — R059 Cough, unspecified: Secondary | ICD-10-CM | POA: Diagnosis not present

## 2021-05-23 DIAGNOSIS — U071 COVID-19: Secondary | ICD-10-CM | POA: Diagnosis not present

## 2021-05-23 DIAGNOSIS — R0600 Dyspnea, unspecified: Secondary | ICD-10-CM | POA: Diagnosis not present

## 2021-05-29 DIAGNOSIS — I10 Essential (primary) hypertension: Secondary | ICD-10-CM | POA: Diagnosis not present

## 2021-05-31 DIAGNOSIS — D72829 Elevated white blood cell count, unspecified: Secondary | ICD-10-CM | POA: Diagnosis not present

## 2021-05-31 DIAGNOSIS — R5383 Other fatigue: Secondary | ICD-10-CM | POA: Diagnosis not present

## 2021-05-31 DIAGNOSIS — K862 Cyst of pancreas: Secondary | ICD-10-CM | POA: Diagnosis not present

## 2021-05-31 DIAGNOSIS — U099 Post covid-19 condition, unspecified: Secondary | ICD-10-CM | POA: Diagnosis not present

## 2021-05-31 DIAGNOSIS — R1013 Epigastric pain: Secondary | ICD-10-CM | POA: Diagnosis not present

## 2021-05-31 DIAGNOSIS — Z0001 Encounter for general adult medical examination with abnormal findings: Secondary | ICD-10-CM | POA: Diagnosis not present

## 2021-08-01 DIAGNOSIS — M170 Bilateral primary osteoarthritis of knee: Secondary | ICD-10-CM | POA: Diagnosis not present

## 2021-08-01 DIAGNOSIS — M25561 Pain in right knee: Secondary | ICD-10-CM | POA: Diagnosis not present

## 2021-09-12 DIAGNOSIS — M170 Bilateral primary osteoarthritis of knee: Secondary | ICD-10-CM | POA: Diagnosis not present

## 2021-09-12 DIAGNOSIS — M25561 Pain in right knee: Secondary | ICD-10-CM | POA: Diagnosis not present

## 2021-10-31 DIAGNOSIS — J069 Acute upper respiratory infection, unspecified: Secondary | ICD-10-CM | POA: Diagnosis not present

## 2021-11-24 DIAGNOSIS — I10 Essential (primary) hypertension: Secondary | ICD-10-CM | POA: Diagnosis not present

## 2021-11-28 DIAGNOSIS — R1013 Epigastric pain: Secondary | ICD-10-CM | POA: Diagnosis not present

## 2021-11-28 DIAGNOSIS — K862 Cyst of pancreas: Secondary | ICD-10-CM | POA: Diagnosis not present

## 2021-11-28 DIAGNOSIS — I10 Essential (primary) hypertension: Secondary | ICD-10-CM | POA: Diagnosis not present

## 2021-11-28 DIAGNOSIS — Z6834 Body mass index (BMI) 34.0-34.9, adult: Secondary | ICD-10-CM | POA: Diagnosis not present

## 2021-11-28 DIAGNOSIS — F5101 Primary insomnia: Secondary | ICD-10-CM | POA: Diagnosis not present

## 2021-11-28 DIAGNOSIS — D72829 Elevated white blood cell count, unspecified: Secondary | ICD-10-CM | POA: Diagnosis not present

## 2021-11-28 DIAGNOSIS — R5383 Other fatigue: Secondary | ICD-10-CM | POA: Diagnosis not present

## 2021-11-28 DIAGNOSIS — J45909 Unspecified asthma, uncomplicated: Secondary | ICD-10-CM | POA: Diagnosis not present

## 2021-11-28 DIAGNOSIS — E669 Obesity, unspecified: Secondary | ICD-10-CM | POA: Diagnosis not present

## 2021-11-28 DIAGNOSIS — R0609 Other forms of dyspnea: Secondary | ICD-10-CM | POA: Diagnosis not present

## 2021-12-07 DIAGNOSIS — G4730 Sleep apnea, unspecified: Secondary | ICD-10-CM | POA: Diagnosis not present

## 2022-03-06 ENCOUNTER — Ambulatory Visit (INDEPENDENT_AMBULATORY_CARE_PROVIDER_SITE_OTHER): Payer: Medicare Other

## 2022-03-06 ENCOUNTER — Ambulatory Visit (INDEPENDENT_AMBULATORY_CARE_PROVIDER_SITE_OTHER): Payer: Medicare Other | Admitting: Orthopaedic Surgery

## 2022-03-06 ENCOUNTER — Ambulatory Visit: Payer: BLUE CROSS/BLUE SHIELD

## 2022-03-06 ENCOUNTER — Encounter: Payer: Self-pay | Admitting: Orthopaedic Surgery

## 2022-03-06 VITALS — Ht 65.0 in | Wt 194.0 lb

## 2022-03-06 DIAGNOSIS — G8929 Other chronic pain: Secondary | ICD-10-CM

## 2022-03-06 DIAGNOSIS — M1712 Unilateral primary osteoarthritis, left knee: Secondary | ICD-10-CM | POA: Diagnosis not present

## 2022-03-06 DIAGNOSIS — M25561 Pain in right knee: Secondary | ICD-10-CM

## 2022-03-06 DIAGNOSIS — M1711 Unilateral primary osteoarthritis, right knee: Secondary | ICD-10-CM

## 2022-03-06 DIAGNOSIS — M25562 Pain in left knee: Secondary | ICD-10-CM

## 2022-03-06 MED ORDER — BUPIVACAINE HCL 0.5 % IJ SOLN
2.0000 mL | INTRAMUSCULAR | Status: AC | PRN
  Administered 2022-03-06: 2 mL via INTRA_ARTICULAR

## 2022-03-06 MED ORDER — LIDOCAINE HCL 1 % IJ SOLN
2.0000 mL | INTRAMUSCULAR | Status: AC | PRN
  Administered 2022-03-06: 2 mL

## 2022-03-06 MED ORDER — METHYLPREDNISOLONE ACETATE 40 MG/ML IJ SUSP
40.0000 mg | INTRAMUSCULAR | Status: AC | PRN
  Administered 2022-03-06: 40 mg via INTRA_ARTICULAR

## 2022-03-06 NOTE — Progress Notes (Signed)
Office Visit Note   Patient: Elizabeth Roberson           Date of Birth: 07/26/1950           MRN: [id number] Visit Date: 03/06/2022              Requested by: Celene Squibb, MD [place],  [place] [address] PCP: Celene Squibb, MD   Assessment & Plan: Visit Diagnoses:  1. Primary osteoarthritis of left knee   2. Primary osteoarthritis of right knee     Plan: Impression is bilateral knee degenerative joint disease worse on the left.  We had a long discussion on treatment options and sounds like she is still able to live with the pain for now.  We will continue conservative management.  Both knees were injected with cortisone today.  Provided information on Visco supplementation.  Denies nickel allergy or history of DVT.  Follow-Up Instructions: No follow-ups on file.   Orders:  Orders Placed This Encounter  Procedures   XR KNEE 3 VIEW LEFT   XR KNEE 3 VIEW RIGHT   No orders of the defined types were placed in this encounter.     Procedures: Large Joint Inj: bilateral knee on 03/06/2022 2:57 PM Indications: pain Details: 22 G needle  Arthrogram: No  Medications (Right): 2 mL lidocaine 1 %; 2 mL bupivacaine 0.5 %; 40 mg methylPREDNISolone acetate 40 MG/ML Medications (Left): 2 mL lidocaine 1 %; 2 mL bupivacaine 0.5 %; 40 mg methylPREDNISolone acetate 40 MG/ML Outcome: tolerated well, no immediate complications Patient was prepped and draped in the usual sterile fashion.       Clinical Data: No additional findings.   Subjective: Chief Complaint  Patient presents with   Right Knee - Pain   Left Knee - Pain    HPI Elizabeth Roberson is a 71 year old female who we saw a few years back for knee pain.  She saw Dr. Lyla Glassing in November last year and received a cortisone injection which provided relief for about 3 to 4 months.  She has decided to continue her care back at our office.  Currently takes Celebrex as needed.  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  Feels of falling sensation occasionally.  Swells during the day with increased activity.  Denies any mechanical symptoms.  Review of Systems  Constitutional: Negative.   HENT: Negative.    Eyes: Negative.   Respiratory: Negative.    Cardiovascular: Negative.   Endocrine: Negative.   Musculoskeletal: Negative.   Neurological: Negative.   Hematological: Negative.   Psychiatric/Behavioral: Negative.    All other systems reviewed and are negative.    Objective: Vital Signs: Ht '5\' 5"'$  (1.651 m)   Wt 194 lb (88 kg)   BMI 32.28 kg/m   Physical Exam Vitals and nursing note reviewed.  Constitutional:      Appearance: She is well-developed.  HENT:     Head: Atraumatic.     Nose: Nose normal.  Eyes:     Extraocular Movements: Extraocular movements intact.  Cardiovascular:     Pulses: Normal pulses.  Pulmonary:     Effort: Pulmonary effort is normal.  Abdominal:     Palpations: Abdomen is soft.  Musculoskeletal:     Cervical back: Neck supple.  Skin:    General: Skin is warm.     Capillary Refill: Capillary refill takes less than 2 seconds.  Neurological:     Mental Status: She is alert. Mental  status is at baseline.  Psychiatric:        Behavior: Behavior normal.        Thought Content: Thought content normal.        Judgment: Judgment normal.     Ortho Exam Examination of bilateral knees show no joint effusion.  Slight joint line tenderness.  Range of motion is slightly decreased.  Collaterals and cruciates are stable. Specialty Comments:  No specialty comments available.  Imaging: XR KNEE 3 VIEW RIGHT  Result Date: 03/06/2022 Advanced tricompartmental degenerative joint disease.  Bone-on-bone joint space narrowing.  XR KNEE 3 VIEW LEFT  Result Date: 03/06/2022 Advanced tricompartmental degenerative joint disease.  Bone-on-bone joint space narrowing.  Varus deformity.    PMFS History: Patient Active Problem List   Diagnosis Date Noted   Primary  osteoarthritis of left knee 03/06/2022   Primary osteoarthritis of right knee 03/06/2022   Esophageal dysphagia 09/17/2017   Dysphagia 09/10/2017   Hip pain, chronic 12/12/2015   Hypertension 03/18/2013   Multiple allergies 03/18/2013   Past Medical History:  Diagnosis Date   Allergy    Dysphagia 09/10/2017   Hip pain, chronic 12/12/2015   Has bilateral hip for past 20 years when walks more than a few minutes, no loss of ROM   Hypertension    Vaginal Pap smear, abnormal    Vasculitis ([place])     Family History  Problem Relation Age of Onset   COPD Mother    Alzheimer's disease Mother    Hypertension Son     Past Surgical History:  Procedure Laterality Date   BALLOON DILATION N/A 07/09/2014   Procedure: BALLOON DILATION;  Surgeon: Rogene Houston, MD;  [facility];  Service: Endoscopy;  Laterality: N/A;   BIOPSY  10/04/2017   Procedure: BIOPSY;  Surgeon: Rogene Houston, MD;  [facility];  Service: Endoscopy;;  gastric   COLONOSCOPY     COLONOSCOPY N/A 07/09/2014   Procedure: COLONOSCOPY;  Surgeon: Rogene Houston, MD;  [facility];  Service: Endoscopy;  Laterality: N/A;  730   ESOPHAGEAL DILATION N/A 10/04/2017   Procedure: ESOPHAGEAL DILATION;  Surgeon: Rogene Houston, MD;  [facility];  Service: Endoscopy;  Laterality: N/A;   ESOPHAGOGASTRODUODENOSCOPY N/A 07/09/2014   Procedure: ESOPHAGOGASTRODUODENOSCOPY (EGD);  Surgeon: Rogene Houston, MD;  [facility];  Service: Endoscopy;  Laterality: N/A;   ESOPHAGOGASTRODUODENOSCOPY N/A 10/04/2017   Procedure: ESOPHAGOGASTRODUODENOSCOPY (EGD);  Surgeon: Rogene Houston, MD;  [facility];  Service: Endoscopy;  Laterality: N/A;  8:55   laser surgery on cervix     MALONEY DILATION N/A 07/09/2014   Procedure: MALONEY DILATION;  Surgeon: Rogene Houston, MD;  [facility];  Service: Endoscopy;  Laterality: N/A;   TUBAL LIGATION     Social History    Occupational History   Not on file  Tobacco Use   Smoking status: Never   Smokeless tobacco: Never  Vaping Use   Vaping Use: Never used  Substance and Sexual Activity   Alcohol use: No   Drug use: No   Sexual activity: Yes    Birth control/protection: Post-menopausal, Surgical    Comment: tubal

## 2022-04-05 DIAGNOSIS — H2513 Age-related nuclear cataract, bilateral: Secondary | ICD-10-CM | POA: Diagnosis not present

## 2022-04-05 DIAGNOSIS — H5203 Hypermetropia, bilateral: Secondary | ICD-10-CM | POA: Diagnosis not present

## 2022-04-30 DIAGNOSIS — G4733 Obstructive sleep apnea (adult) (pediatric): Secondary | ICD-10-CM | POA: Diagnosis not present

## 2022-04-30 DIAGNOSIS — M170 Bilateral primary osteoarthritis of knee: Secondary | ICD-10-CM | POA: Diagnosis not present

## 2022-05-24 DIAGNOSIS — I10 Essential (primary) hypertension: Secondary | ICD-10-CM | POA: Diagnosis not present

## 2022-05-31 DIAGNOSIS — E669 Obesity, unspecified: Secondary | ICD-10-CM | POA: Diagnosis not present

## 2022-05-31 DIAGNOSIS — R5383 Other fatigue: Secondary | ICD-10-CM | POA: Diagnosis not present

## 2022-05-31 DIAGNOSIS — R1013 Epigastric pain: Secondary | ICD-10-CM | POA: Diagnosis not present

## 2022-05-31 DIAGNOSIS — M170 Bilateral primary osteoarthritis of knee: Secondary | ICD-10-CM | POA: Diagnosis not present

## 2022-05-31 DIAGNOSIS — G4733 Obstructive sleep apnea (adult) (pediatric): Secondary | ICD-10-CM | POA: Diagnosis not present

## 2022-05-31 DIAGNOSIS — K862 Cyst of pancreas: Secondary | ICD-10-CM | POA: Diagnosis not present

## 2022-05-31 DIAGNOSIS — Z0000 Encounter for general adult medical examination without abnormal findings: Secondary | ICD-10-CM | POA: Diagnosis not present

## 2022-05-31 DIAGNOSIS — D72829 Elevated white blood cell count, unspecified: Secondary | ICD-10-CM | POA: Diagnosis not present

## 2022-05-31 DIAGNOSIS — I10 Essential (primary) hypertension: Secondary | ICD-10-CM | POA: Diagnosis not present

## 2022-05-31 DIAGNOSIS — R0609 Other forms of dyspnea: Secondary | ICD-10-CM | POA: Diagnosis not present

## 2022-05-31 DIAGNOSIS — H269 Unspecified cataract: Secondary | ICD-10-CM | POA: Diagnosis not present

## 2022-05-31 DIAGNOSIS — J45909 Unspecified asthma, uncomplicated: Secondary | ICD-10-CM | POA: Diagnosis not present

## 2022-06-04 ENCOUNTER — Other Ambulatory Visit: Payer: Self-pay

## 2022-06-04 ENCOUNTER — Encounter (HOSPITAL_COMMUNITY): Payer: Self-pay

## 2022-06-04 DIAGNOSIS — Z79899 Other long term (current) drug therapy: Secondary | ICD-10-CM | POA: Diagnosis not present

## 2022-06-04 DIAGNOSIS — R1013 Epigastric pain: Secondary | ICD-10-CM | POA: Diagnosis not present

## 2022-06-04 DIAGNOSIS — D72829 Elevated white blood cell count, unspecified: Secondary | ICD-10-CM | POA: Diagnosis not present

## 2022-06-04 DIAGNOSIS — I10 Essential (primary) hypertension: Secondary | ICD-10-CM | POA: Diagnosis not present

## 2022-06-04 DIAGNOSIS — K862 Cyst of pancreas: Secondary | ICD-10-CM | POA: Diagnosis not present

## 2022-06-04 DIAGNOSIS — R1011 Right upper quadrant pain: Secondary | ICD-10-CM | POA: Diagnosis not present

## 2022-06-04 DIAGNOSIS — N281 Cyst of kidney, acquired: Secondary | ICD-10-CM | POA: Diagnosis not present

## 2022-06-04 LAB — CBC
HCT: 39.2 % (ref 36.0–46.0)
Hemoglobin: 13.4 g/dL (ref 12.0–15.0)
MCH: 30.7 pg (ref 26.0–34.0)
MCHC: 34.2 g/dL (ref 30.0–36.0)
MCV: 89.7 fL (ref 80.0–100.0)
Platelets: 292 10*3/uL (ref 150–400)
RBC: 4.37 MIL/uL (ref 3.87–5.11)
RDW: 13 % (ref 11.5–15.5)
WBC: 16.3 10*3/uL — ABNORMAL HIGH (ref 4.0–10.5)
nRBC: 0 % (ref 0.0–0.2)

## 2022-06-04 NOTE — ED Triage Notes (Signed)
Pt c/o epigastric pain that began after she ate dinner around 1800 tonight. States the pain is constant and is nauseous. Pt states she did not try to take any medications because she is scheduled to have eye surgery in the AM.

## 2022-06-05 ENCOUNTER — Emergency Department (HOSPITAL_COMMUNITY)
Admission: EM | Admit: 2022-06-05 | Discharge: 2022-06-05 | Disposition: A | Discharge status: Home / Self Care | Payer: Medicare Other | Attending: Emergency Medicine | Admitting: Emergency Medicine

## 2022-06-05 ENCOUNTER — Emergency Department (HOSPITAL_COMMUNITY): Payer: Medicare Other

## 2022-06-05 DIAGNOSIS — Z961 Presence of intraocular lens: Secondary | ICD-10-CM | POA: Diagnosis not present

## 2022-06-05 DIAGNOSIS — K862 Cyst of pancreas: Secondary | ICD-10-CM | POA: Diagnosis not present

## 2022-06-05 DIAGNOSIS — N281 Cyst of kidney, acquired: Secondary | ICD-10-CM | POA: Diagnosis not present

## 2022-06-05 DIAGNOSIS — R1013 Epigastric pain: Secondary | ICD-10-CM | POA: Diagnosis not present

## 2022-06-05 DIAGNOSIS — H269 Unspecified cataract: Secondary | ICD-10-CM | POA: Diagnosis not present

## 2022-06-05 DIAGNOSIS — H2511 Age-related nuclear cataract, right eye: Secondary | ICD-10-CM | POA: Diagnosis not present

## 2022-06-05 LAB — COMPREHENSIVE METABOLIC PANEL
ALT: 20 U/L (ref 0–44)
AST: 18 U/L (ref 15–41)
Albumin: 4.2 g/dL (ref 3.5–5.0)
Alkaline Phosphatase: 86 U/L (ref 38–126)
Anion gap: 9 (ref 5–15)
BUN: 18 mg/dL (ref 8–23)
CO2: 25 mmol/L (ref 22–32)
Calcium: 9.2 mg/dL (ref 8.9–10.3)
Chloride: 104 mmol/L (ref 98–111)
Creatinine, Ser: 1.08 mg/dL — ABNORMAL HIGH (ref 0.44–1.00)
GFR, Estimated: 55 mL/min — ABNORMAL LOW (ref >60)
Glucose, Bld: 122 mg/dL — ABNORMAL HIGH (ref 70–99)
Potassium: 3.6 mmol/L (ref 3.5–5.1)
Sodium: 138 mmol/L (ref 135–145)
Total Bilirubin: 0.4 mg/dL (ref 0.3–1.2)
Total Protein: 7.3 g/dL (ref 6.5–8.1)

## 2022-06-05 LAB — URINALYSIS, ROUTINE W REFLEX MICROSCOPIC
Bacteria, UA: NONE SEEN
Bilirubin Urine: NEGATIVE
Glucose, UA: NEGATIVE mg/dL
Hgb urine dipstick: NEGATIVE
Ketones, ur: 20 mg/dL — AB
Leukocytes,Ua: NEGATIVE
Nitrite: NEGATIVE
Protein, ur: 30 mg/dL — AB
Specific Gravity, Urine: 1.021 (ref 1.005–1.030)
pH: 5 (ref 5.0–8.0)

## 2022-06-05 LAB — LIPASE, BLOOD: Lipase: 27 U/L (ref 11–51)

## 2022-06-05 MED ORDER — SODIUM CHLORIDE 0.9 % IV BOLUS
1000.0000 mL | Freq: Once | INTRAVENOUS | Status: AC
  Administered 2022-06-05: 1000 mL via INTRAVENOUS

## 2022-06-05 MED ORDER — IOHEXOL 300 MG/ML  SOLN
100.0000 mL | Freq: Once | INTRAMUSCULAR | Status: AC | PRN
  Administered 2022-06-05: 100 mL via INTRAVENOUS

## 2022-06-05 NOTE — ED Notes (Signed)
Patient transported to CT 

## 2022-06-05 NOTE — ED Provider Notes (Signed)
[facility] EMERGENCY DEPARTMENT Provider Note   CSN: [id number] Arrival date & time: 06/04/22  2301     History  Chief Complaint  Patient presents with   Abdominal Pain    Elizabeth Roberson is a 71 y.o. female.  Patient is a 71 year old female with a past medical history of hypertension, GERD.  Patient presenting today with complaints of epigastric pain.  This started earlier this evening after eating a piece of pork.  She described significant discomfort to the epigastric region that was crampy in nature, but constant.  This lasted approximately 4 hours, then she presented here for evaluation.  She reports an episode of emesis in the waiting room, then symptoms have since eased up.  She denies any fevers or chills.  She denies any bloody vomit or stool.  She denies any diarrhea or constipation.  The history is provided by the patient.       Home Medications Prior to Admission medications   Medication Sig Start Date End Date Taking? Authorizing Provider  acetaminophen (TYLENOL) 500 MG tablet Take 500 mg by mouth every 6 (six) hours as needed for moderate pain or headache.    [provider]  albuterol (PROVENTIL HFA;VENTOLIN HFA) 108 (90 BASE) MCG/ACT inhaler Inhale 2 puffs into the lungs every 6 (six) hours as needed for wheezing or shortness of breath.    [provider]  celecoxib (CELEBREX) 200 MG capsule Take 200 mg by mouth daily. 05/25/20   [provider]  ketotifen (ZADITOR) 0.025 % ophthalmic solution Place 1 drop into both eyes 2 (two) times daily as needed (for dry eyes).    [provider]  levocetirizine (XYZAL) 5 MG tablet Take 5 mg by mouth every evening.    [provider]  losartan (COZAAR) 50 MG tablet Take 50 mg by mouth daily. 07/18/17   [provider]  methylPREDNISolone (MEDROL DOSEPAK) 4 MG TBPK tablet Take as directed 12/15/20   Trula Slade, DPM  pantoprazole (PROTONIX) 40 MG tablet Take 1  tablet (40 mg total) by mouth 2 (two) times daily before a meal. 10/04/17   Rehman, Mechele Dawley, MD      Allergies    Patient has no known allergies.    Review of Systems   Review of Systems  All other systems reviewed and are negative.   Physical Exam Updated Vital Signs BP (!) 153/74   Pulse 73   Temp 97.9 F (36.6 C) (Oral)   Resp 18   Ht '5\' 5"'$  (1.651 m)   Wt 90 kg   SpO2 98%   BMI 33.02 kg/m  Physical Exam Vitals and nursing note reviewed.  Constitutional:      General: She is not in acute distress.    Appearance: She is well-developed. She is not diaphoretic.  HENT:     Head: Normocephalic and atraumatic.  Cardiovascular:     Rate and Rhythm: Normal rate and regular rhythm.     Heart sounds: No murmur heard.    No friction rub. No gallop.  Pulmonary:     Effort: Pulmonary effort is normal. No respiratory distress.     Breath sounds: Normal breath sounds. No wheezing.  Abdominal:     General: Bowel sounds are normal. There is no distension.     Palpations: Abdomen is soft.     Tenderness: There is abdominal tenderness in the right upper quadrant and epigastric area. There is no right CVA tenderness, left CVA tenderness, guarding or rebound.  Musculoskeletal:        General: Normal range of motion.     Cervical back: Normal range of motion and neck supple.  Skin:    General: Skin is warm and dry.  Neurological:     General: No focal deficit present.     Mental Status: She is alert and oriented to person, place, and time.     ED Results / Procedures / Treatments   Labs (all labs ordered are listed, but only abnormal results are displayed) Labs Reviewed  COMPREHENSIVE METABOLIC PANEL - Abnormal; Notable for the following components:      Result Value   Glucose, Bld 122 (*)    Creatinine, Ser 1.08 (*)    GFR, Estimated 55 (*)    All other components within normal limits  CBC - Abnormal; Notable for the following components:   WBC 16.3 (*)    All other  components within normal limits  URINALYSIS, ROUTINE W REFLEX MICROSCOPIC - Abnormal; Notable for the following components:   Ketones, ur 20 (*)    Protein, ur 30 (*)    All other components within normal limits  LIPASE, BLOOD    EKG EKG Interpretation  Date/Time:  Monday June 04 2022 23:20:51 EST Ventricular Rate:  79 PR Interval:  148 QRS Duration: 86 QT Interval:  402 QTC Calculation: 460 R Axis:   -22 Text Interpretation: Normal sinus rhythm Low voltage QRS Cannot rule out Anterior infarct , age undetermined Abnormal ECG No previous ECGs available Confirmed by Veryl Speak [id number]) on 06/04/2022 11:34:34 PM  Radiology No results found.  Procedures Procedures    Medications Ordered in ED Medications  sodium chloride 0.9 % bolus 1,000 mL (has no administration in time range)    ED Course/ Medical Decision Making/ A&P  Patient is a 71 year old female with past medical history as per HPI.  She presents today with epigastric pain that started earlier this evening after eating pork.  Pain lasted for approximately 4 hours, then subsided shortly after arrival to the ER.  Patient arrives here with stable vital signs and is afebrile.  She has mild tenderness to palpation in the epigastric region, but no peritoneal signs.  Physical examination is otherwise unremarkable.  Work-up initiated including CBC, CMP, and lipase.  She has a leukocytosis with white count of 16,000, but normal liver and pancreatic enzymes.  CT scan obtained as well showing no acute intra-abdominal process and no explanation for her symptoms.  Patient's pain has since resolved and now feels better without intervention.  At this point, I feel as though patient can safely be discharged with outpatient follow-up and as needed return.  Final Clinical Impression(s) / ED Diagnoses Final diagnoses:  None    Rx / DC Orders ED Discharge Orders     None         Veryl Speak, MD 06/05/22 [phone]

## 2022-06-05 NOTE — Discharge Instructions (Signed)
Follow-up with primary doctor if symptoms recur and return to the ER if symptoms significantly worsen or change.

## 2022-06-11 ENCOUNTER — Telehealth: Payer: Self-pay

## 2022-06-11 NOTE — Telephone Encounter (Signed)
     Patient  visit on 11/14  at [place] you been able to follow up with your primary care physician? Yes   The patient was or was not able to obtain any needed medicine or equipment. Yes   Are there diet recommendations that you are having difficulty following? Yes   Patient expresses understanding of discharge instructions and education provided has no other needs at this time.  Yes      [place], [facility], Care Management  [phone] [address], [place], [place] [address] Phone: [phone] Email: [email or web address]

## 2022-07-17 DIAGNOSIS — H2512 Age-related nuclear cataract, left eye: Secondary | ICD-10-CM | POA: Diagnosis not present

## 2022-07-17 DIAGNOSIS — H269 Unspecified cataract: Secondary | ICD-10-CM | POA: Diagnosis not present

## 2022-07-29 DIAGNOSIS — G4733 Obstructive sleep apnea (adult) (pediatric): Secondary | ICD-10-CM | POA: Diagnosis not present

## 2022-08-27 DIAGNOSIS — R053 Chronic cough: Secondary | ICD-10-CM | POA: Diagnosis not present

## 2022-08-27 DIAGNOSIS — J019 Acute sinusitis, unspecified: Secondary | ICD-10-CM | POA: Diagnosis not present

## 2022-08-29 DIAGNOSIS — G4733 Obstructive sleep apnea (adult) (pediatric): Secondary | ICD-10-CM | POA: Diagnosis not present

## 2022-09-27 DIAGNOSIS — G4733 Obstructive sleep apnea (adult) (pediatric): Secondary | ICD-10-CM | POA: Diagnosis not present

## 2022-10-28 DIAGNOSIS — G4733 Obstructive sleep apnea (adult) (pediatric): Secondary | ICD-10-CM | POA: Diagnosis not present

## 2022-11-23 DIAGNOSIS — I10 Essential (primary) hypertension: Secondary | ICD-10-CM | POA: Diagnosis not present

## 2022-11-27 DIAGNOSIS — G4733 Obstructive sleep apnea (adult) (pediatric): Secondary | ICD-10-CM | POA: Diagnosis not present

## 2022-11-29 ENCOUNTER — Other Ambulatory Visit (HOSPITAL_COMMUNITY): Payer: Self-pay | Admitting: Internal Medicine

## 2022-11-29 DIAGNOSIS — M170 Bilateral primary osteoarthritis of knee: Secondary | ICD-10-CM | POA: Diagnosis not present

## 2022-11-29 DIAGNOSIS — G4733 Obstructive sleep apnea (adult) (pediatric): Secondary | ICD-10-CM | POA: Diagnosis not present

## 2022-11-29 DIAGNOSIS — Z13820 Encounter for screening for osteoporosis: Secondary | ICD-10-CM

## 2022-11-29 DIAGNOSIS — R1013 Epigastric pain: Secondary | ICD-10-CM | POA: Diagnosis not present

## 2022-11-29 DIAGNOSIS — J45909 Unspecified asthma, uncomplicated: Secondary | ICD-10-CM | POA: Diagnosis not present

## 2022-11-29 DIAGNOSIS — R0609 Other forms of dyspnea: Secondary | ICD-10-CM | POA: Diagnosis not present

## 2022-11-29 DIAGNOSIS — Z6833 Body mass index (BMI) 33.0-33.9, adult: Secondary | ICD-10-CM | POA: Diagnosis not present

## 2022-11-29 DIAGNOSIS — K862 Cyst of pancreas: Secondary | ICD-10-CM | POA: Diagnosis not present

## 2022-11-29 DIAGNOSIS — I10 Essential (primary) hypertension: Secondary | ICD-10-CM | POA: Diagnosis not present

## 2022-11-29 DIAGNOSIS — R5383 Other fatigue: Secondary | ICD-10-CM | POA: Diagnosis not present

## 2022-11-29 DIAGNOSIS — D72829 Elevated white blood cell count, unspecified: Secondary | ICD-10-CM | POA: Diagnosis not present

## 2022-11-29 DIAGNOSIS — H269 Unspecified cataract: Secondary | ICD-10-CM | POA: Diagnosis not present

## 2022-11-29 DIAGNOSIS — Z1231 Encounter for screening mammogram for malignant neoplasm of breast: Secondary | ICD-10-CM

## 2022-11-29 DIAGNOSIS — E669 Obesity, unspecified: Secondary | ICD-10-CM | POA: Diagnosis not present

## 2022-12-06 ENCOUNTER — Ambulatory Visit (HOSPITAL_COMMUNITY)
Admission: RE | Admit: 2022-12-06 | Discharge: 2022-12-06 | Disposition: A | Discharge status: Home / Self Care | Payer: Medicare HMO | Source: Ambulatory Visit | Attending: Internal Medicine | Admitting: Internal Medicine

## 2022-12-06 ENCOUNTER — Encounter (HOSPITAL_COMMUNITY): Payer: Self-pay

## 2022-12-06 DIAGNOSIS — Z13820 Encounter for screening for osteoporosis: Secondary | ICD-10-CM | POA: Diagnosis not present

## 2022-12-06 DIAGNOSIS — Z1231 Encounter for screening mammogram for malignant neoplasm of breast: Secondary | ICD-10-CM | POA: Diagnosis not present

## 2022-12-06 DIAGNOSIS — M85851 Other specified disorders of bone density and structure, right thigh: Secondary | ICD-10-CM | POA: Insufficient documentation

## 2022-12-06 DIAGNOSIS — K8689 Other specified diseases of pancreas: Secondary | ICD-10-CM | POA: Diagnosis not present

## 2022-12-06 DIAGNOSIS — K862 Cyst of pancreas: Secondary | ICD-10-CM | POA: Insufficient documentation

## 2022-12-06 DIAGNOSIS — Z780 Asymptomatic menopausal state: Secondary | ICD-10-CM | POA: Insufficient documentation

## 2022-12-06 DIAGNOSIS — N281 Cyst of kidney, acquired: Secondary | ICD-10-CM | POA: Diagnosis not present

## 2022-12-06 MED ORDER — IOHEXOL 300 MG/ML  SOLN
100.0000 mL | Freq: Once | INTRAMUSCULAR | Status: AC | PRN
  Administered 2022-12-06: 100 mL via INTRAVENOUS

## 2022-12-14 DIAGNOSIS — G4733 Obstructive sleep apnea (adult) (pediatric): Secondary | ICD-10-CM | POA: Diagnosis not present

## 2022-12-28 DIAGNOSIS — G4733 Obstructive sleep apnea (adult) (pediatric): Secondary | ICD-10-CM | POA: Diagnosis not present

## 2023-01-14 DIAGNOSIS — G4733 Obstructive sleep apnea (adult) (pediatric): Secondary | ICD-10-CM | POA: Diagnosis not present

## 2023-01-27 DIAGNOSIS — G4733 Obstructive sleep apnea (adult) (pediatric): Secondary | ICD-10-CM | POA: Diagnosis not present

## 2023-02-13 DIAGNOSIS — G4733 Obstructive sleep apnea (adult) (pediatric): Secondary | ICD-10-CM | POA: Diagnosis not present

## 2023-02-21 DIAGNOSIS — J019 Acute sinusitis, unspecified: Secondary | ICD-10-CM | POA: Diagnosis not present

## 2023-02-27 DIAGNOSIS — G4733 Obstructive sleep apnea (adult) (pediatric): Secondary | ICD-10-CM | POA: Diagnosis not present

## 2023-03-19 DIAGNOSIS — G4733 Obstructive sleep apnea (adult) (pediatric): Secondary | ICD-10-CM | POA: Diagnosis not present

## 2023-03-30 DIAGNOSIS — G4733 Obstructive sleep apnea (adult) (pediatric): Secondary | ICD-10-CM | POA: Diagnosis not present

## 2023-03-30 DIAGNOSIS — Z8249 Family history of ischemic heart disease and other diseases of the circulatory system: Secondary | ICD-10-CM | POA: Diagnosis not present

## 2023-03-30 DIAGNOSIS — I10 Essential (primary) hypertension: Secondary | ICD-10-CM | POA: Diagnosis not present

## 2023-03-30 DIAGNOSIS — M8580 Other specified disorders of bone density and structure, unspecified site: Secondary | ICD-10-CM | POA: Diagnosis not present

## 2023-03-30 DIAGNOSIS — K219 Gastro-esophageal reflux disease without esophagitis: Secondary | ICD-10-CM | POA: Diagnosis not present

## 2023-03-30 DIAGNOSIS — J45909 Unspecified asthma, uncomplicated: Secondary | ICD-10-CM | POA: Diagnosis not present

## 2023-03-30 DIAGNOSIS — M1990 Unspecified osteoarthritis, unspecified site: Secondary | ICD-10-CM | POA: Diagnosis not present

## 2023-04-03 ENCOUNTER — Ambulatory Visit: Payer: Medicare HMO | Admitting: Orthopaedic Surgery

## 2023-04-19 DIAGNOSIS — G4733 Obstructive sleep apnea (adult) (pediatric): Secondary | ICD-10-CM | POA: Diagnosis not present

## 2023-04-22 DIAGNOSIS — Z961 Presence of intraocular lens: Secondary | ICD-10-CM | POA: Diagnosis not present

## 2023-05-19 DIAGNOSIS — G4733 Obstructive sleep apnea (adult) (pediatric): Secondary | ICD-10-CM | POA: Diagnosis not present

## 2023-05-28 DIAGNOSIS — I10 Essential (primary) hypertension: Secondary | ICD-10-CM | POA: Diagnosis not present

## 2023-06-11 DIAGNOSIS — H269 Unspecified cataract: Secondary | ICD-10-CM | POA: Diagnosis not present

## 2023-06-11 DIAGNOSIS — M8580 Other specified disorders of bone density and structure, unspecified site: Secondary | ICD-10-CM | POA: Diagnosis not present

## 2023-06-11 DIAGNOSIS — I10 Essential (primary) hypertension: Secondary | ICD-10-CM | POA: Diagnosis not present

## 2023-06-11 DIAGNOSIS — R0609 Other forms of dyspnea: Secondary | ICD-10-CM | POA: Diagnosis not present

## 2023-06-11 DIAGNOSIS — K219 Gastro-esophageal reflux disease without esophagitis: Secondary | ICD-10-CM | POA: Diagnosis not present

## 2023-06-11 DIAGNOSIS — G4733 Obstructive sleep apnea (adult) (pediatric): Secondary | ICD-10-CM | POA: Diagnosis not present

## 2023-06-11 DIAGNOSIS — D72829 Elevated white blood cell count, unspecified: Secondary | ICD-10-CM | POA: Diagnosis not present

## 2023-06-11 DIAGNOSIS — J45909 Unspecified asthma, uncomplicated: Secondary | ICD-10-CM | POA: Diagnosis not present

## 2023-06-11 DIAGNOSIS — K862 Cyst of pancreas: Secondary | ICD-10-CM | POA: Diagnosis not present

## 2023-06-11 DIAGNOSIS — E669 Obesity, unspecified: Secondary | ICD-10-CM | POA: Diagnosis not present

## 2023-06-11 DIAGNOSIS — R945 Abnormal results of liver function studies: Secondary | ICD-10-CM | POA: Diagnosis not present

## 2023-06-11 DIAGNOSIS — M170 Bilateral primary osteoarthritis of knee: Secondary | ICD-10-CM | POA: Diagnosis not present

## 2023-08-06 DIAGNOSIS — Z7951 Long term (current) use of inhaled steroids: Secondary | ICD-10-CM | POA: Diagnosis not present

## 2023-08-06 DIAGNOSIS — R059 Cough, unspecified: Secondary | ICD-10-CM | POA: Diagnosis not present

## 2023-08-06 DIAGNOSIS — J45909 Unspecified asthma, uncomplicated: Secondary | ICD-10-CM | POA: Diagnosis not present

## 2023-12-04 DIAGNOSIS — G4733 Obstructive sleep apnea (adult) (pediatric): Secondary | ICD-10-CM | POA: Diagnosis not present

## 2023-12-05 DIAGNOSIS — K862 Cyst of pancreas: Secondary | ICD-10-CM | POA: Diagnosis not present

## 2023-12-05 DIAGNOSIS — M8580 Other specified disorders of bone density and structure, unspecified site: Secondary | ICD-10-CM | POA: Diagnosis not present

## 2023-12-05 DIAGNOSIS — E559 Vitamin D deficiency, unspecified: Secondary | ICD-10-CM | POA: Diagnosis not present

## 2023-12-05 DIAGNOSIS — M170 Bilateral primary osteoarthritis of knee: Secondary | ICD-10-CM | POA: Diagnosis not present

## 2023-12-05 DIAGNOSIS — R809 Proteinuria, unspecified: Secondary | ICD-10-CM | POA: Diagnosis not present

## 2023-12-05 DIAGNOSIS — E669 Obesity, unspecified: Secondary | ICD-10-CM | POA: Diagnosis not present

## 2023-12-05 DIAGNOSIS — K219 Gastro-esophageal reflux disease without esophagitis: Secondary | ICD-10-CM | POA: Diagnosis not present

## 2023-12-05 DIAGNOSIS — R945 Abnormal results of liver function studies: Secondary | ICD-10-CM | POA: Diagnosis not present

## 2023-12-05 DIAGNOSIS — D72829 Elevated white blood cell count, unspecified: Secondary | ICD-10-CM | POA: Diagnosis not present

## 2023-12-05 DIAGNOSIS — I10 Essential (primary) hypertension: Secondary | ICD-10-CM | POA: Diagnosis not present

## 2023-12-05 DIAGNOSIS — R944 Abnormal results of kidney function studies: Secondary | ICD-10-CM | POA: Diagnosis not present

## 2023-12-05 DIAGNOSIS — G4733 Obstructive sleep apnea (adult) (pediatric): Secondary | ICD-10-CM | POA: Diagnosis not present

## 2024-02-27 ENCOUNTER — Other Ambulatory Visit (HOSPITAL_COMMUNITY): Payer: Self-pay | Admitting: Internal Medicine

## 2024-02-27 ENCOUNTER — Other Ambulatory Visit (HOSPITAL_COMMUNITY): Payer: Self-pay | Admitting: Family Medicine

## 2024-02-27 DIAGNOSIS — Z1231 Encounter for screening mammogram for malignant neoplasm of breast: Secondary | ICD-10-CM

## 2024-03-11 ENCOUNTER — Ambulatory Visit (HOSPITAL_COMMUNITY)
Admission: RE | Admit: 2024-03-11 | Discharge: 2024-03-11 | Disposition: A | Discharge status: Home / Self Care | Source: Ambulatory Visit | Attending: Internal Medicine | Admitting: Internal Medicine

## 2024-03-11 DIAGNOSIS — Z1231 Encounter for screening mammogram for malignant neoplasm of breast: Secondary | ICD-10-CM | POA: Insufficient documentation

## 2024-03-16 ENCOUNTER — Other Ambulatory Visit (HOSPITAL_COMMUNITY): Payer: Self-pay | Admitting: Internal Medicine

## 2024-03-16 DIAGNOSIS — R928 Other abnormal and inconclusive findings on diagnostic imaging of breast: Secondary | ICD-10-CM

## 2024-03-16 DIAGNOSIS — G4733 Obstructive sleep apnea (adult) (pediatric): Secondary | ICD-10-CM | POA: Diagnosis not present

## 2024-03-17 ENCOUNTER — Ambulatory Visit (HOSPITAL_COMMUNITY)
Admission: RE | Admit: 2024-03-17 | Discharge: 2024-03-17 | Disposition: A | Discharge status: Home / Self Care | Source: Ambulatory Visit | Attending: Internal Medicine | Admitting: Internal Medicine

## 2024-03-17 ENCOUNTER — Encounter (HOSPITAL_COMMUNITY): Payer: Self-pay

## 2024-03-17 DIAGNOSIS — R928 Other abnormal and inconclusive findings on diagnostic imaging of breast: Secondary | ICD-10-CM | POA: Insufficient documentation

## 2024-03-17 DIAGNOSIS — R92321 Mammographic fibroglandular density, right breast: Secondary | ICD-10-CM | POA: Diagnosis not present

## 2024-05-27 DIAGNOSIS — H10413 Chronic giant papillary conjunctivitis, bilateral: Secondary | ICD-10-CM | POA: Diagnosis not present

## 2024-05-27 DIAGNOSIS — Z961 Presence of intraocular lens: Secondary | ICD-10-CM | POA: Diagnosis not present

## 2024-06-05 DIAGNOSIS — K219 Gastro-esophageal reflux disease without esophagitis: Secondary | ICD-10-CM | POA: Diagnosis not present

## 2024-06-05 DIAGNOSIS — Z0000 Encounter for general adult medical examination without abnormal findings: Secondary | ICD-10-CM | POA: Diagnosis not present

## 2024-06-05 DIAGNOSIS — E559 Vitamin D deficiency, unspecified: Secondary | ICD-10-CM | POA: Diagnosis not present

## 2024-06-05 DIAGNOSIS — D72829 Elevated white blood cell count, unspecified: Secondary | ICD-10-CM | POA: Diagnosis not present

## 2024-06-05 DIAGNOSIS — M170 Bilateral primary osteoarthritis of knee: Secondary | ICD-10-CM | POA: Diagnosis not present

## 2024-06-05 DIAGNOSIS — I10 Essential (primary) hypertension: Secondary | ICD-10-CM | POA: Diagnosis not present

## 2024-06-05 DIAGNOSIS — G4733 Obstructive sleep apnea (adult) (pediatric): Secondary | ICD-10-CM | POA: Diagnosis not present

## 2024-06-05 DIAGNOSIS — E669 Obesity, unspecified: Secondary | ICD-10-CM | POA: Diagnosis not present

## 2024-06-05 DIAGNOSIS — R809 Proteinuria, unspecified: Secondary | ICD-10-CM | POA: Diagnosis not present

## 2024-06-05 DIAGNOSIS — H269 Unspecified cataract: Secondary | ICD-10-CM | POA: Diagnosis not present

## 2024-06-05 DIAGNOSIS — R945 Abnormal results of liver function studies: Secondary | ICD-10-CM | POA: Diagnosis not present

## 2024-06-05 DIAGNOSIS — N289 Disorder of kidney and ureter, unspecified: Secondary | ICD-10-CM | POA: Diagnosis not present

## 2024-06-24 DIAGNOSIS — G4733 Obstructive sleep apnea (adult) (pediatric): Secondary | ICD-10-CM | POA: Diagnosis not present

## 2024-07-09 ENCOUNTER — Encounter (INDEPENDENT_AMBULATORY_CARE_PROVIDER_SITE_OTHER): Payer: Self-pay | Admitting: *Deleted

## 2024-07-12 DIAGNOSIS — Z1212 Encounter for screening for malignant neoplasm of rectum: Secondary | ICD-10-CM | POA: Diagnosis not present

## 2024-07-12 DIAGNOSIS — Z1211 Encounter for screening for malignant neoplasm of colon: Secondary | ICD-10-CM | POA: Diagnosis not present

## 2024-07-17 LAB — COLOGUARD: COLOGUARD: NEGATIVE

## 2024-08-26 ENCOUNTER — Telehealth (INDEPENDENT_AMBULATORY_CARE_PROVIDER_SITE_OTHER): Payer: Self-pay

## 2024-08-26 NOTE — Telephone Encounter (Signed)
Ok to schedule.  Room :Any   Thanks,  Vista Lawman, MD Gastroenterology and Hepatology [facility]

## 2024-08-26 NOTE — Telephone Encounter (Signed)
 Who is your primary care physician: Christyne Hurst, MD  Reasons for the colonoscopy: screening, history of polyps  Have you had a colonoscopy before?  Yes, 07/09/2014  Do you have family history of colon cancer? no  Previous colonoscopy with polyps removed? Yes, 07/09/2014  Do you have a history colorectal cancer?   no  Are you diabetic? If yes, Type 1 or Type 2?    no  Do you have a prosthetic or mechanical heart valve? no  Do you have a pacemaker/defibrillator?   no  Have you had endocarditis/atrial fibrillation? no  Have you had joint replacement within the last 12 months?  no  Do you tend to be constipated or have to use laxatives? no  Do you have any history of drugs or alcohol?  no  Do you use supplemental oxygen?  no  Have you had a stroke or heart attack within the last 6 months? no  Do you take weight loss medication?  no  For female patients: have you had a hysterectomy?  no                                     are you post menopausal?       yes                                            do you still have your menstrual cycle? no      Do you take any blood-thinning medications such as: (aspirin, warfarin, Plavix, Aggrenox)  no  If yes we need the name, milligram, dosage and who is prescribing doctor   Current Outpatient Medications  Medication Sig Dispense Refill   albuterol (PROVENTIL HFA;VENTOLIN HFA) 108 (90 BASE) MCG/ACT inhaler Inhale 2 puffs into the lungs every 6 (six) hours as needed for wheezing or shortness of breath.     levocetirizine (XYZAL) 5 MG tablet Take 5 mg by mouth every evening.     losartan (COZAAR) 50 MG tablet Take 50 mg by mouth daily.  1   pantoprazole  (PROTONIX ) 40 MG tablet Take 1 tablet (40 mg total) by mouth 2 (two) times daily before a meal. 60 tablet 2   No current facility-administered medications for this visit.    Allergies[1]  Pharmacy: [facility]  Primary Insurance Name: Hulan Many  Best number where you can be reached:  [phone]     [1] No Known Allergies

## 2024-08-27 MED ORDER — PEG 3350-KCL-NA BICARB-NACL 420 G PO SOLR: 4000.0000 mL | Freq: Once | ORAL | 0 refills | Status: AC

## 2024-08-27 NOTE — Telephone Encounter (Signed)
 [employer] does not require PA for TCS on [employer]

## 2024-08-27 NOTE — Telephone Encounter (Signed)
Recall completed. 

## 2024-08-27 NOTE — Addendum Note (Signed)
 Addended by: DALLIE LIONEL RAMAN on: 08/27/2024 03:14 PM   Modules accepted: Orders

## 2024-08-27 NOTE — Telephone Encounter (Signed)
 Spoke with patient, scheduled TCS for 09/01/2024 at 8am. Rx sent to pharmacy. Instructions sent to mychart.

## 2024-08-27 NOTE — Telephone Encounter (Signed)
 Patient requesting I call her later as she is traveling to a doctors appointment.

## 2024-09-01 ENCOUNTER — Encounter (HOSPITAL_COMMUNITY): Admission: RE | Payer: Self-pay | Source: Home / Self Care

## 2024-09-01 ENCOUNTER — Ambulatory Visit (HOSPITAL_COMMUNITY): Admission: RE | Admit: 2024-09-01 | Source: Home / Self Care | Admitting: Gastroenterology
